# Patient Record
Sex: Female | Born: 1945 | Race: White | Hispanic: No | Marital: Married | Smoking: Former smoker
Health system: Southern US, Community
[De-identification: ages and names within clinical notes are randomized; demographics above are authoritative.]

## PROBLEM LIST (undated history)

## (undated) DIAGNOSIS — M8580 Other specified disorders of bone density and structure, unspecified site: Secondary | ICD-10-CM

## (undated) DIAGNOSIS — M26609 Unspecified temporomandibular joint disorder, unspecified side: Secondary | ICD-10-CM

## (undated) HISTORY — PX: OTHER SURGICAL HISTORY: SHX169

## (undated) HISTORY — DX: Unspecified temporomandibular joint disorder, unspecified side: M26.609

## (undated) HISTORY — DX: Other specified disorders of bone density and structure, unspecified site: M85.80

## (undated) HISTORY — PX: ABDOMINAL HYSTERECTOMY: SHX81

---

## 1998-06-03 ENCOUNTER — Other Ambulatory Visit: Admission: RE | Admit: 1998-06-03 | Discharge: 1998-06-03 | Payer: Self-pay | Admitting: *Deleted

## 1999-06-21 ENCOUNTER — Other Ambulatory Visit: Admission: RE | Admit: 1999-06-21 | Discharge: 1999-06-21 | Payer: Self-pay | Admitting: *Deleted

## 2000-06-20 ENCOUNTER — Other Ambulatory Visit: Admission: RE | Admit: 2000-06-20 | Discharge: 2000-06-20 | Payer: Self-pay | Admitting: *Deleted

## 2001-07-03 ENCOUNTER — Other Ambulatory Visit: Admission: RE | Admit: 2001-07-03 | Discharge: 2001-07-03 | Payer: Self-pay | Admitting: *Deleted

## 2003-07-29 ENCOUNTER — Ambulatory Visit (HOSPITAL_COMMUNITY): Admission: RE | Admit: 2003-07-29 | Discharge: 2003-07-29 | Payer: Self-pay | Admitting: Internal Medicine

## 2003-08-26 ENCOUNTER — Ambulatory Visit (HOSPITAL_COMMUNITY): Admission: RE | Admit: 2003-08-26 | Discharge: 2003-08-26 | Payer: Self-pay | Admitting: Internal Medicine

## 2003-08-26 ENCOUNTER — Encounter (INDEPENDENT_AMBULATORY_CARE_PROVIDER_SITE_OTHER): Payer: Self-pay | Admitting: Internal Medicine

## 2010-11-29 ENCOUNTER — Encounter: Payer: Self-pay | Admitting: Orthopedic Surgery

## 2010-12-07 ENCOUNTER — Ambulatory Visit: Payer: Self-pay | Admitting: Orthopedic Surgery

## 2010-12-07 DIAGNOSIS — M629 Disorder of muscle, unspecified: Secondary | ICD-10-CM

## 2011-01-24 NOTE — Letter (Signed)
Summary: note  note   Imported By: Jacklynn Ganong 11/29/2010 16:26:43  _____________________________________________________________________  External Attachment:    Type:   Image     Comment:   External Document

## 2011-01-26 NOTE — Letter (Signed)
Summary: History form  History form   Imported By: Jacklynn Ganong 12/07/2010 13:32:52  _____________________________________________________________________  External Attachment:    Type:   Image     Comment:   External Document

## 2011-01-26 NOTE — Assessment & Plan Note (Signed)
Summary: EVAL/TREAT RT KNEE CYST/NEED XRAY/REF DR FAGAN/[facility]   Vital Signs:  Patient profile:   65 year old female Height:      64 inches Weight:      120 pounds Pulse rate:   68 / minute Resp:     16 per minute  Vitals Entered By: Fuller Canada MD (December 07, 2010 10:47 AM)  Visit Type:  new patient Referring Zeriah Baysinger:  self Primary Mckynlee Luse:  Dr. Ouida Sills  CC:  cyst right knee.  History of Present Illness: I saw Cris Schellinger in the office today for an initial visit.  She is a 65 years old woman with the complaint of:  cyst right knee.  Xrays today.  Meds: Flexeril as needed   We have a 65 year old female, who complains of dull, 1/10. Aching sensation associated with the mass, or cyst on the lateral side of the RIGHT knee, which seems to come and go depending on whether or not. She is exercising. Symptoms started gradually. Up to this point. No associated treatments    Allergies (verified): No Known Drug Allergies  Past History:  Past Medical History: tmj  Past Surgical History: 3 C SECTIONS  Family History: FH of Cancer:   Social History: Patient is married.  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 Cardiovascular:  Denies chest pain, palpitations, fainting, and murmurs. Respiratory:  Denies short of breath, wheezing, couch, tightness, pain on inspiration, and snoring . Gastrointestinal:  Denies heartburn, nausea, vomiting, diarrhea, constipation, and blood in your stools. Genitourinary:  Denies frequency, urgency, difficulty urinating, painful urination, flank pain, and bleeding in urine. Neurologic:  Denies numbness, tingling, unsteady gait, dizziness, tremors, and seizure. Musculoskeletal:  Complains of joint pain; denies swelling, instability, stiffness, redness, heat, and muscle pain. Endocrine:  Denies  excessive thirst, exessive urination, and heat or cold intolerance. Psychiatric:  Denies nervousness, depression, anxiety, and hallucinations. Skin:  Denies changes in the skin, poor healing, rash, itching, and redness. HEENT:  Denies blurred or double vision, eye pain, redness, and watering. Immunology:  Denies seasonal allergies, sinus problems, and allergic to bee stings. Hemoatologic:  Complains of easy bleeding; denies brusing.  Physical Exam  Additional Exam:  GEN: well developed, well nourished, normal grooming and hygiene, no deformity and normal body habitus.   CDV: pulses are normal, no edema, no erythema. no tenderness  Lymph: normal lymph nodes   Skin: no rashes, skin lesions or open sores   NEURO: normal coordination, reflexes, sensation.   Psyche: awake, alert and oriented. Mood normal   Gait:  right knee:  Inspection lateral side of the knee at the iliotibial band insertion to Gertie's tubercle. There is a mass noted, which appears to be subcutaneous, and is somewhat uncomfortable to palpation, but no significant tenderness. Range of motion of the knee is normal. There is no joint effusion. Strengthening is normal. The knee is stable. McMurray sign was negative.   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 There is no evidence of cyst formation.  Impression mild degenerative joint disease.  Differential diagnosis lipoma, iliotibial band bursitis, meniscal  cyst.  Most likely diagnosis iliotibial band bursitis.  Pain right now is 1/10. I suggested that when this lesion swells that we needle aspirated and try to get some fluid to confirm the diagnosis.  Orders: New Patient Level III  (13086) Knee x-ray, 1/2 views (57846)  Patient Instructions: 1)  If you have a flare up call us and we will get you in to aspirate the cyst, take fluid out.   Orders Added: 1)  New Patient Level III [96295] 2)  Knee x-ray, 1/2 views [73560]

## 2011-05-12 NOTE — Op Note (Signed)
   NAME:  Kelsey Hill, Kelsey Hill                          ACCOUNT NO.:  [id number]   MEDICAL RECORD NO.:  [id number]                   PATIENT TYPE:  AMB   LOCATION:  DAY                                  FACILITY:  [facility]   PHYSICIAN:  Lionel December, M.D.                 DATE OF BIRTH:  [birth year]   DATE OF PROCEDURE:  07/29/2003  DATE OF DISCHARGE:                                  PROCEDURE NOTE   PROCEDURE:  Total colonoscopy.   INDICATIONS:  Kelsey Hill is a 65 year old Caucasian female who is here for  screening colonoscopy.  She is at average risk for CRC.  The procedure was  reviewed with the patient and informed consent was obtained.   PREMEDICATION:  Demerol 50 mg IV, Versed 6 mg IV.   FINDINGS:  Procedure performed in endoscopy suite.  The patient's vital  signs and O2 saturation were monitored during the procedure and remained  stable.  The patient was placed in the left lateral recumbent position and  rectal examination performed.  No abnormality noted on external or digital  exam.  The Olympus video scope was placed into the rectum and advanced under  vision to the sigmoid colon, which was somewhat tortuous and had a lot of  formed and semi-formed stool.  Slowly and carefully after vigorous washing,  I was able to advance the scope to the splenic flexure and to the transverse  colon and hepatic flexure and into the ascending colon.  This area had a lot  of stool, and the cecal landmarks were not seen at all.  As the scope was  withdrawn, the colonic mucosa was once again carefully examined and no  polyps or other mucosal abnormalities were noted.  The rectal mucosa was  normal.  The scope was retroflexed to examine the anorectal junction, and  small hemorrhoids were noted below the dentate line.  The endoscope was  straightened and withdrawn.  The patient tolerated the procedure well.   FINAL DIAGNOSES:  1. Examination performed to the ascending colon with cecal landmarks not  seen secondary to poor prep.  2. Small external hemorrhoids.   RECOMMENDATIONS:  Will bring her back in a few weeks for a BE.                                               Lionel December, M.D.    NR/MEDQ  D:  07/29/2003  T:  07/29/2003  Job:  [id number]   cc:   Kingsley Callander. Ouida Sills, M.D.  [address]  [place]  [place] [address]  Fax: [phone]

## 2012-08-02 ENCOUNTER — Telehealth: Payer: Self-pay | Admitting: Orthopedic Surgery

## 2012-08-02 NOTE — Telephone Encounter (Signed)
Received call from patient's primary care physician's office, Dr. Ouida Sills (ph [phone]), per Duwayne Heck; requests last office notes, which include Xray report.  Sent as per request, to fax # [phone].

## 2012-08-05 ENCOUNTER — Other Ambulatory Visit (HOSPITAL_COMMUNITY): Payer: Self-pay | Admitting: Internal Medicine

## 2012-08-05 DIAGNOSIS — R2240 Localized swelling, mass and lump, unspecified lower limb: Secondary | ICD-10-CM

## 2012-08-06 ENCOUNTER — Ambulatory Visit (HOSPITAL_COMMUNITY)
Admission: RE | Admit: 2012-08-06 | Discharge: 2012-08-06 | Disposition: A | Discharge status: Home / Self Care | Payer: Medicare Other | Source: Ambulatory Visit | Attending: Internal Medicine | Admitting: Internal Medicine

## 2012-08-06 DIAGNOSIS — R937 Abnormal findings on diagnostic imaging of other parts of musculoskeletal system: Secondary | ICD-10-CM | POA: Insufficient documentation

## 2012-08-06 DIAGNOSIS — M25569 Pain in unspecified knee: Secondary | ICD-10-CM | POA: Insufficient documentation

## 2012-08-06 DIAGNOSIS — R2240 Localized swelling, mass and lump, unspecified lower limb: Secondary | ICD-10-CM

## 2012-09-05 ENCOUNTER — Ambulatory Visit (INDEPENDENT_AMBULATORY_CARE_PROVIDER_SITE_OTHER): Payer: Medicare Other | Admitting: Otolaryngology

## 2012-09-05 DIAGNOSIS — H8110 Benign paroxysmal vertigo, unspecified ear: Secondary | ICD-10-CM

## 2012-09-05 DIAGNOSIS — H9209 Otalgia, unspecified ear: Secondary | ICD-10-CM

## 2013-08-15 ENCOUNTER — Encounter (INDEPENDENT_AMBULATORY_CARE_PROVIDER_SITE_OTHER): Payer: Self-pay | Admitting: *Deleted

## 2014-09-08 ENCOUNTER — Encounter (INDEPENDENT_AMBULATORY_CARE_PROVIDER_SITE_OTHER): Payer: Self-pay | Admitting: *Deleted

## 2014-10-01 ENCOUNTER — Ambulatory Visit (INDEPENDENT_AMBULATORY_CARE_PROVIDER_SITE_OTHER): Payer: 59 | Admitting: Internal Medicine

## 2014-10-01 ENCOUNTER — Other Ambulatory Visit (INDEPENDENT_AMBULATORY_CARE_PROVIDER_SITE_OTHER): Payer: Self-pay | Admitting: *Deleted

## 2014-10-01 ENCOUNTER — Telehealth (INDEPENDENT_AMBULATORY_CARE_PROVIDER_SITE_OTHER): Payer: Self-pay | Admitting: *Deleted

## 2014-10-01 ENCOUNTER — Encounter (INDEPENDENT_AMBULATORY_CARE_PROVIDER_SITE_OTHER): Payer: Self-pay | Admitting: Internal Medicine

## 2014-10-01 VITALS — BP 104/54 | HR 72 | Temp 97.7°F | Ht 63.5 in | Wt 125.7 lb

## 2014-10-01 DIAGNOSIS — R1310 Dysphagia, unspecified: Secondary | ICD-10-CM

## 2014-10-01 DIAGNOSIS — M2660 Temporomandibular joint disorder, unspecified: Secondary | ICD-10-CM

## 2014-10-01 DIAGNOSIS — Z1211 Encounter for screening for malignant neoplasm of colon: Secondary | ICD-10-CM | POA: Insufficient documentation

## 2014-10-01 DIAGNOSIS — M26609 Unspecified temporomandibular joint disorder, unspecified side: Secondary | ICD-10-CM | POA: Insufficient documentation

## 2014-10-01 DIAGNOSIS — R1314 Dysphagia, pharyngoesophageal phase: Secondary | ICD-10-CM | POA: Insufficient documentation

## 2014-10-01 MED ORDER — PEG-KCL-NACL-NASULF-NA ASC-C 100 G PO SOLR
1.0000 | Freq: Once | ORAL | Status: DC
Start: 2014-10-01 — End: 2014-11-26

## 2014-10-01 NOTE — Progress Notes (Signed)
   Subjective:    Patient ID: Kelsey Hill, female    DOB: May 14, 1946, 68 y.o.   MRN: [id number]  HPI Referred to our office by Dr. Ouida SillsFagan for dyspagia/EGD/ED and screening colonoscopy. She tells me at times she get choked. She says foods will lodge in her esophagus. Sometimes she will choke on her saliva. She will get choked on nuts.She does have GERD and sometimes she will take a Zantac 75mg  or a TUMS. Appetite is good. No weight unintentional. No abdominal pain. Robbie LouisU*suallyh has a BM x 1 a day and occasionally every other day.    08/11/2014 H and H 13.5 and 38.6, MCV 89.6, ALP 80, AST 21, ALT 15, total bili 0.4, albumin 4.4  07/29/2003 Colonoscopy: average risk screening, Dr.Rehman: FINAL DIAGNOSES:  1. Examination performed to the ascending colon with cecal landmarks not  seen secondary to poor prep.  2. Small external hemorrhoids.  RECOMMENDATIONS: Will bring her back in a few weeks for a BE.  08/26/2003 DG Colon with air: INFLAMMATORY BOWEL DISEASE.  IMPRESSION  NORMAL STUDY. IT IS OF NOTE, HOWEVER, THAT THE COLON IS EXTREMELY REDUNDANT, PARTICULARLY THE  SIGMOID COLON AND TRANSVERSE COLON AND THE BOWEL SUPERIMPOSES IN THESE AREAS AND VISUALIZATION OF  THESE AREAS IS OBSCURED.   Review of Systems Past Medical History  Diagnosis Date  . TMJ disease     Past Surgical History  Procedure Laterality Date  . C sections      x 3    Allergies  Allergen Reactions  . Oysters [Shellfish Allergy]     N and V    No current outpatient prescriptions on file prior to visit.   No current facility-administered medications on file prior to visit.        Objective:   Physical Exam  Filed Vitals:   10/01/14 1034  BP: 104/54  Pulse: 72  Temp: 97.7 F (36.5 C)  Height: 5' 3.5" (1.613 m)  Weight: 125 lb 11.2 oz (57.017 kg)   Alert and oriented. Skin warm and dry. Oral mucosa is moist.   . Sclera anicteric, conjunctivae is pink. Thyroid not enlarged. No cervical lymphadenopathy.  Lungs clear. Heart regular rate and rhythm.  Abdomen is soft. Bowel sounds are positive. No hepatomegaly. No abdominal masses felt. No tenderness.  No edema to lower extremities.         Assessment & Plan:  EGD/ED, Colonoscopy. The risks and benefits such as perforation, bleeding, and infection were reviewed with the patient and is agreeable.

## 2014-10-01 NOTE — Telephone Encounter (Signed)
Patient needs movi prep 

## 2014-10-01 NOTE — Patient Instructions (Signed)
EGD/ED Colonoscopy.  The risks and benefits such as perforation, bleeding, and infection were reviewed with the patient and is agreeable. 

## 2014-11-26 ENCOUNTER — Ambulatory Visit (HOSPITAL_COMMUNITY)
Admission: RE | Admit: 2014-11-26 | Discharge: 2014-11-26 | Disposition: A | Discharge status: Home / Self Care | Payer: Medicare Other | Source: Ambulatory Visit | Attending: Internal Medicine | Admitting: Internal Medicine

## 2014-11-26 ENCOUNTER — Encounter (HOSPITAL_COMMUNITY)
Admission: RE | Disposition: A | Discharge status: Home / Self Care | Payer: Self-pay | Source: Ambulatory Visit | Attending: Internal Medicine

## 2014-11-26 DIAGNOSIS — K208 Other esophagitis: Secondary | ICD-10-CM

## 2014-11-26 DIAGNOSIS — Z800 Family history of malignant neoplasm of digestive organs: Secondary | ICD-10-CM | POA: Diagnosis not present

## 2014-11-26 DIAGNOSIS — R1310 Dysphagia, unspecified: Secondary | ICD-10-CM

## 2014-11-26 DIAGNOSIS — K449 Diaphragmatic hernia without obstruction or gangrene: Secondary | ICD-10-CM

## 2014-11-26 DIAGNOSIS — K210 Gastro-esophageal reflux disease with esophagitis: Secondary | ICD-10-CM | POA: Diagnosis not present

## 2014-11-26 DIAGNOSIS — K649 Unspecified hemorrhoids: Secondary | ICD-10-CM | POA: Insufficient documentation

## 2014-11-26 DIAGNOSIS — K219 Gastro-esophageal reflux disease without esophagitis: Secondary | ICD-10-CM

## 2014-11-26 DIAGNOSIS — Z1211 Encounter for screening for malignant neoplasm of colon: Secondary | ICD-10-CM

## 2014-11-26 DIAGNOSIS — Z91013 Allergy to seafood: Secondary | ICD-10-CM | POA: Diagnosis not present

## 2014-11-26 HISTORY — PX: MALONEY DILATION: SHX5535

## 2014-11-26 HISTORY — PX: COLONOSCOPY: SHX5424

## 2014-11-26 HISTORY — PX: ESOPHAGOGASTRODUODENOSCOPY: SHX5428

## 2014-11-26 SURGERY — COLONOSCOPY
Anesthesia: Moderate Sedation

## 2014-11-26 MED ORDER — MIDAZOLAM HCL 5 MG/5ML IJ SOLN
INTRAMUSCULAR | Status: AC
  Filled 2014-11-26: qty 10

## 2014-11-26 MED ORDER — STERILE WATER FOR IRRIGATION IR SOLN
Status: DC | PRN
  Administered 2014-11-26: 13:00:00

## 2014-11-26 MED ORDER — MEPERIDINE HCL 50 MG/ML IJ SOLN
INTRAMUSCULAR | Status: DC | PRN
  Administered 2014-11-26 (×2): 25 mg via INTRAVENOUS

## 2014-11-26 MED ORDER — RANITIDINE HCL 150 MG PO TABS: 150.0000 mg | ORAL_TABLET | Freq: Two times a day (BID) | ORAL | Status: DC | PRN

## 2014-11-26 MED ORDER — BUTAMBEN-TETRACAINE-BENZOCAINE 2-2-14 % EX AERO
INHALATION_SPRAY | CUTANEOUS | Status: DC | PRN
  Administered 2014-11-26: 2 via TOPICAL

## 2014-11-26 MED ORDER — SODIUM CHLORIDE 0.9 % IV SOLN
INTRAVENOUS | Status: DC
  Administered 2014-11-26: 12:00:00 via INTRAVENOUS

## 2014-11-26 MED ORDER — MEPERIDINE HCL 50 MG/ML IJ SOLN
INTRAMUSCULAR | Status: AC
  Filled 2014-11-26: qty 1

## 2014-11-26 MED ORDER — MIDAZOLAM HCL 5 MG/5ML IJ SOLN
INTRAMUSCULAR | Status: DC | PRN
  Administered 2014-11-26: 2 mg via INTRAVENOUS
  Administered 2014-11-26: 1 mg via INTRAVENOUS
  Administered 2014-11-26: 2 mg via INTRAVENOUS
  Administered 2014-11-26 (×3): 1 mg via INTRAVENOUS
  Administered 2014-11-26: 2 mg via INTRAVENOUS

## 2014-11-26 NOTE — H&P (Signed)
Kelsey Hill is an 68 y.o. female.   Chief Complaint: Patient here for EGD, ED and colonoscopy. HPI: Patient is 68 year old Caucasian female who presents with several month history of intermittent dysphagia primarily to solids. She points to suprasternal areas site of bolus obstruction. She has intermittent heartburn she is able to controlled with OTC medications. She denies nausea vomiting abdominal pain or melena. She is also undergoing screening colonoscopy. She denies recent change in her bowel habits or rectal bleeding. Family history is positive for CRC in paternal aunt who was in her 77s or 52s at the time of diagnosis.  Past Medical History  Diagnosis Date  . TMJ disease     Past Surgical History  Procedure Laterality Date  . C sections      x 3    No family history on file. Social History:  reports that she has never smoked. She does not have any smokeless tobacco history on file. She reports that she drinks alcohol. She reports that she does not use illicit drugs.  Allergies:  Allergies  Allergen Reactions  . Oysters [Shellfish Allergy]     N and V    Medications Prior to Admission  Medication Sig Dispense Refill  . amitriptyline (ELAVIL) 25 MG tablet Take 25 mg by mouth at bedtime.    . Biotin 5000 MCG TABS Take 1 tablet by mouth daily.    . Calcium Citrate (CITRACAL PO) Take 1 tablet by mouth 2 (two) times daily.    . Cholecalciferol (VITAMIN D3) 5000 UNITS TABS Take 1 tablet by mouth daily.    . Multiple Vitamins-Minerals (MULTIVITAMINS THER. W/MINERALS) TABS tablet Take 1 tablet by mouth daily.    . peg 3350 powder (MOVIPREP) 100 G SOLR Take 1 kit (200 g total) by mouth once. 1 kit 0  . TURMERIC PO Take 1 tablet by mouth daily.    . vitamin C (ASCORBIC ACID) 500 MG tablet Take 500 mg by mouth daily.      No results found for this or any previous visit (from the past 48 hour(s)). No results found.  ROS  Blood pressure 155/74, pulse 85, temperature 97.7 F  (36.5 C), temperature source Oral, resp. rate 11, SpO2 99 %. Physical Exam  Constitutional: She appears well-developed and well-nourished.  HENT:  Mouth/Throat: Oropharynx is clear and moist.  Eyes: Conjunctivae are normal. No scleral icterus.  Neck: No thyromegaly present.  Cardiovascular: Normal rate, regular rhythm and normal heart sounds.   No murmur heard. Respiratory: Effort normal and breath sounds normal.  GI: Soft. She exhibits no distension and no mass. There is no tenderness.  Lower midline scar  Musculoskeletal: She exhibits no edema.  Lymphadenopathy:    She has no cervical adenopathy.  Neurological: She is alert.  Skin: Skin is warm and dry.     Assessment/Plan Solid food dysphagia and intermittent heartburn. EGD with ED and average risk screening colonoscopy.  Josedaniel Haye U 11/26/2014, 1:20 PM

## 2014-11-26 NOTE — Discharge Instructions (Signed)
Resume usual medications and diet. Zantac OTC or Ranitidine 150 mg by mouth twice daily as needed. No driving for 24 hours. Please call office with progress report in one week.  Gastrointestinal Endoscopy, Care After Refer to this sheet in the next few weeks. These instructions provide you with information on caring for yourself after your procedure. Your caregiver may also give you more specific instructions. Your treatment has been planned according to current medical practices, but problems sometimes occur. Call your caregiver if you have any problems or questions after your procedure. HOME CARE INSTRUCTIONS  If you were given medicine to help you relax (sedative), do not drive, operate machinery, or sign important documents for 24 hours.  Avoid alcohol and hot or warm beverages for the first 24 hours after the procedure.  Only take over-the-counter or prescription medicines for pain, discomfort, or fever as directed by your caregiver. You may resume taking your normal medicines unless your caregiver tells you otherwise. Ask your caregiver when you may resume taking medicines that may cause bleeding, such as aspirin, clopidogrel, or warfarin.  You may return to your normal diet and activities on the day after your procedure, or as directed by your caregiver. Walking may help to reduce any bloated feeling in your abdomen.  Drink enough fluids to keep your urine clear or pale yellow.  You may gargle with salt water if you have a sore throat. SEEK IMMEDIATE MEDICAL CARE IF:  You have severe nausea or vomiting.  You have severe abdominal pain, abdominal cramps that last longer than 6 hours, or abdominal swelling (distention).  You have severe shoulder or back pain.  You have trouble swallowing.  You have shortness of breath, your breathing is shallow, or you are breathing faster than normal.  You have a fever or a rapid heartbeat.  You vomit blood or material that looks like coffee  grounds.  You have bloody, black, or tarry stools. MAKE SURE YOU:  Understand these instructions.  Will watch your condition.  Will get help right away if you are not doing well or get worse. Document Released: 07/25/2004 Document Revised: 04/27/2014 Document Reviewed: 03/12/2012 [facility] Patient Information 2015 [employer] [place]. This information is not intended to replace advice given to you by your health care provider. Make sure you discuss any questions you have with your health care provider.

## 2014-11-26 NOTE — Op Note (Signed)
EGD PROCEDURE REPORT  PATIENT:  Kelsey Hill  MR#:  [id number] Birthdate:  [birth year], 68 y.o., female Endoscopist:  Dr. Malissa HippoNajeeb U. Joscelyn Hardrick, MD Referred By:  Dr. Carylon Perchesoy Fagan, MD  Procedure Date: 11/26/2014  Procedure:   EGD, ED & Colonoscopy  Indications:   Patient is 68 year old Caucasian female who presents with history of intermittent solid dysphagia. She has sporadic heartburn for which she takes OTC medications. She is also undergoing average risk screening colonoscopy. Last exam was in 2004 and was incomplete and followed by barium enema.           Informed Consent:  The risks, benefits, alternatives & imponderables which include, but are not limited to, bleeding, infection, perforation, drug reaction and potential missed lesion have been reviewed.  The potential for biopsy, lesion removal, esophageal dilation, etc. have also been discussed.  Questions have been answered.  All parties agreeable.  Please see history & physical in medical record for more information.  Medications:  Demerol 50 mg IV Versed 10 mg IV Cetacaine spray topically for oropharyngeal anesthesia  EGD  Description of procedure:  The endoscope was introduced through the mouth and advanced to the second portion of the duodenum without difficulty or limitations. The mucosal surfaces were surveyed very carefully during advancement of the scope and upon withdrawal.  Findings:  Esophagus:  Mucosa of the esophagus was normal. Focal erythema edema noted at GE junction without ring or stricture formation. GEJ:  37 cm Hiatus:  39 cm Stomach:  There was empty and distended very well with insufflation. Folds in the proximal stomach were normal. Examination of mucosa at body, antrum, pyloric channel, angularis, fundus and cardia was normal. Duodenum:  Normal bulbar and post bulbar mucosa.  Therapeutic/Diagnostic Maneuvers Performed:   Esophagus dilated by passing 54 French dilator to full insertion. Endoscope was passed again  and no mucosal disruption noted.  COLONOSCOPY Description of procedure:  After a digital rectal exam was performed, that colonoscope was advanced from the anus through the rectum and colon to the area of the cecum, ileocecal valve and appendiceal orifice. The cecum was deeply intubated. These structures were well-seen and photographed for the record. From the level of the cecum and ileocecal valve, the scope was slowly and cautiously withdrawn. The mucosal surfaces were carefully surveyed utilizing scope tip to flexion to facilitate fold flattening as needed. The scope was pulled down into the rectum where a thorough exam including retroflexion was performed. Examination completed using Slim scope.  Findings:   Prep excellent. Redundant, tortuous and freely mobile colon. Normal mucosa of cecum, ascending colon, hepatic flexure, transverse colon, splenic flexure, descending and sigmoid colon. Normal rectal mucosa. Small hemorrhoids below the dentate line.   Therapeutic/Diagnostic Maneuvers Performed:  None  Complications:  None  Cecal Withdrawal Time:  7 minutes  Impression:  EGD findings; Small sliding hiatal hernia with mild changes of reflux esophagitis limited to GE junction. No evidence of stricture or Schatzki's ring. Esophagus dilated by passing 54 French Maloney dilator but no mucosal disruption induced.  Colonoscopy findings; Examination performed to cecum. Very redundant and tortuous colon but no polyps or other abnormalities. Small external hemorrhoids.  Recommendations:  Continue anti-reflux measures. Zantac OTC 150 mg by mouth twice a day when necessary. Patient advised to call office with progress report in one week.  Ahnaf Caponi U  11/26/2014 3:06 PM  CC: Dr. Cranford MonFAGAN,ROY O & Dr. No ref. provider found

## 2014-11-30 ENCOUNTER — Encounter (HOSPITAL_COMMUNITY): Payer: Self-pay | Admitting: Internal Medicine

## 2014-12-16 ENCOUNTER — Encounter (INDEPENDENT_AMBULATORY_CARE_PROVIDER_SITE_OTHER): Payer: Self-pay

## 2016-06-12 ENCOUNTER — Other Ambulatory Visit (HOSPITAL_COMMUNITY): Payer: Self-pay | Admitting: Internal Medicine

## 2016-06-12 ENCOUNTER — Ambulatory Visit (HOSPITAL_COMMUNITY)
Admission: RE | Admit: 2016-06-12 | Discharge: 2016-06-12 | Disposition: A | Discharge status: Home / Self Care | Payer: Medicare Other | Source: Ambulatory Visit | Attending: Internal Medicine | Admitting: Internal Medicine

## 2016-06-12 DIAGNOSIS — R52 Pain, unspecified: Secondary | ICD-10-CM

## 2016-06-12 DIAGNOSIS — S8261XA Displaced fracture of lateral malleolus of right fibula, initial encounter for closed fracture: Secondary | ICD-10-CM | POA: Diagnosis not present

## 2016-06-12 DIAGNOSIS — X58XXXA Exposure to other specified factors, initial encounter: Secondary | ICD-10-CM | POA: Diagnosis not present

## 2016-07-04 ENCOUNTER — Other Ambulatory Visit (HOSPITAL_COMMUNITY): Payer: Self-pay | Admitting: Orthopedic Surgery

## 2016-07-04 ENCOUNTER — Ambulatory Visit (HOSPITAL_COMMUNITY)
Admission: RE | Admit: 2016-07-04 | Discharge: 2016-07-04 | Disposition: A | Discharge status: Home / Self Care | Payer: Medicare Other | Source: Ambulatory Visit | Attending: Orthopedic Surgery | Admitting: Orthopedic Surgery

## 2016-07-04 DIAGNOSIS — X58XXXD Exposure to other specified factors, subsequent encounter: Secondary | ICD-10-CM | POA: Insufficient documentation

## 2016-07-04 DIAGNOSIS — T148XXA Other injury of unspecified body region, initial encounter: Secondary | ICD-10-CM

## 2016-07-04 DIAGNOSIS — M25471 Effusion, right ankle: Secondary | ICD-10-CM | POA: Diagnosis not present

## 2016-07-04 DIAGNOSIS — S8264XD Nondisplaced fracture of lateral malleolus of right fibula, subsequent encounter for closed fracture with routine healing: Secondary | ICD-10-CM | POA: Insufficient documentation

## 2016-08-09 ENCOUNTER — Encounter (HOSPITAL_COMMUNITY): Payer: Self-pay | Admitting: Physical Therapy

## 2016-08-09 ENCOUNTER — Ambulatory Visit (HOSPITAL_COMMUNITY): Payer: Medicare Other | Attending: Orthopedic Surgery | Admitting: Physical Therapy

## 2016-08-09 DIAGNOSIS — M25571 Pain in right ankle and joints of right foot: Secondary | ICD-10-CM | POA: Diagnosis not present

## 2016-08-09 DIAGNOSIS — R2689 Other abnormalities of gait and mobility: Secondary | ICD-10-CM

## 2016-08-09 DIAGNOSIS — M25674 Stiffness of right foot, not elsewhere classified: Secondary | ICD-10-CM | POA: Diagnosis present

## 2016-08-09 DIAGNOSIS — M6281 Muscle weakness (generalized): Secondary | ICD-10-CM | POA: Diagnosis present

## 2016-08-09 DIAGNOSIS — M25671 Stiffness of right ankle, not elsewhere classified: Secondary | ICD-10-CM | POA: Insufficient documentation

## 2016-08-09 NOTE — Therapy (Signed)
[facility] [address] [place], [place], [address] Phone: [phone]   Fax:  [phone]  Physical Therapy Evaluation  Patient Details  Name: Kelsey Hill MRN: [id number] Date of Birth: Mar 08, 1946 Referring Provider: Salvatore Marvelobert Wainer, MD  Encounter Date: 08/09/2016      PT End of Session - 08/09/16 1008    Visit Number 1   Number of Visits 5   Date for PT Re-Evaluation 09/15/16   Authorization Type [employer] Medicare   Authorization Time Period 08/09/16 to 09/15/16   Authorization - Visit Number 1   Authorization - Number of Visits 5   PT Start Time 0901   PT Stop Time 0945   PT Time Calculation (min) 44 min   Activity Tolerance Patient tolerated treatment well   Behavior During Therapy [facility] for tasks assessed/performed      Past Medical History:  Diagnosis Date  . TMJ disease     Past Surgical History:  Procedure Laterality Date  . c sections     x 3  . COLONOSCOPY N/A 11/26/2014   Procedure: COLONOSCOPY;  Surgeon: Malissa HippoNajeeb U Rehman, MD;  [facility];  Service: Endoscopy;  Laterality: N/A;  200 - moved to 12/3 @ 1:00 - Ann notified pt  . ESOPHAGOGASTRODUODENOSCOPY N/A 11/26/2014   Procedure: ESOPHAGOGASTRODUODENOSCOPY (EGD);  Surgeon: Malissa HippoNajeeb U Rehman, MD;  [facility];  Service: Endoscopy;  Laterality: N/A;  . Elease HashimotoMALONEY DILATION N/A 11/26/2014   Procedure: Elease HashimotoMALONEY DILATION;  Surgeon: Malissa HippoNajeeb U Rehman, MD;  [facility];  Service: Endoscopy;  Laterality: N/A;    There were no vitals filed for this visit.       Subjective Assessment - 08/09/16 0905    Subjective Pt reports fracturing her ankle 06/08/16 when she was outside pruning some bushes. She was pulling, stepped back into a hole and felt a pop. She walked around on it for 4 days and eventually went to get treatment. She was placed in a boot for 8 weeks and is now wearing an ankle brace that she was instructed to wear for 2 more weeks during the  day. She denies any pain currently, and feels that it only bothers her while sleeping when she puts direct pressure on her ankle.    Pertinent History TMJ; reports mild osteopenia but receives bone scans every year   Limitations Other (comment)  going down steps    How long can you sit comfortably? unlimited   How long can you stand comfortably? unlimited    How long can you walk comfortably? limited with minimal pain, but she will walk as far as she needs to     Diagnostic tests X-ray: Minimal partial healing of the the demonstrated lateral malleolus   Patient Stated Goals get back to her aerobics and pilates   Currently in Pain? No/denies   Pain Score 2    Pain Location --  lateral malleolus   Pain Orientation Right   Pain Descriptors / Indicators Aching   Pain Type Acute pain   Pain Radiating Towards none    Pain Onset More than a month ago   Pain Frequency Other (Comment)  only occasionally at night    Aggravating Factors  direct pressure   Pain Relieving Factors not really bothering her that much    Effect of Pain on Daily Activities not limiting her    Multiple Pain Sites No            [facility]  PT Assessment - 08/09/16 0001      Assessment   Medical Diagnosis Rt ankle fracture   Referring Provider Salvatore Marvel, MD   Onset Date/Surgical Date 06/08/16   Next MD Visit 08/22/16   Prior Therapy none      Precautions   Precautions Other (comment)   Precaution Comments no long distance walking, no aerobic dancing     Restrictions   Weight Bearing Restrictions No     Balance Screen   Has the patient fallen in the past 6 months No   Has the patient had a decrease in activity level because of a fear of falling?  No   Is the patient reluctant to leave their home because of a fear of falling?  No     Home [employer] information [employer] manager residence   Living Arrangements Spouse/significant other   Additional Comments 14 steps up to bedroom on 2nd floor, 2-3 STE      Prior Function   Level of Independence Independent   Leisure aerobic dancing and pilates several times a week     Cognition   Overall Cognitive Status Within Functional Limits for tasks assessed     Observation/Other Assessments   Observations minimal swelling noted posterior Rt lateral malleolus     Sensation   Light Touch Appears Intact     ROM / Strength   AROM / PROM / Strength AROM;Strength     AROM   AROM Assessment Site Ankle;Other (comment)  Big toe extension: Rt 10 deg, Lt 25 deg   Right/Left Ankle Right;Left   Right Ankle Dorsiflexion 5   Right Ankle Plantar Flexion 40   Right Ankle Inversion 15   Right Ankle Eversion 20   Left Ankle Dorsiflexion 5   Left Ankle Plantar Flexion 43   Left Ankle Inversion 25   Left Ankle Eversion 20     Strength   Strength Assessment Site Knee;Ankle   Right/Left Knee --   Right/Left Ankle Right;Left   Right Ankle Dorsiflexion 5/5   Right Ankle Plantar Flexion 5/5  discomfort reported in heel   Right Ankle Inversion 5/5  discomfort reported in heel    Right Ankle Eversion 5/5   Left Ankle Dorsiflexion 5/5   Left Ankle Plantar Flexion 5/5   Left Ankle Inversion 5/5   Left Ankle Eversion 5/5     Palpation   Palpation comment minimal tenderness to palpation along Rt lateral malleolus     Ambulation/Gait   Ambulation/Gait Yes   Ambulation Distance (Feet) 50 Feet   Assistive device None  wearing ankle brace   Stairs Yes   Stairs Assistance 6: Modified independent (Device/Increase time)   Gait Comments Rt foot eversion, decreased big toe extension and plantar flexion during terminal stance and pre-swing portion of gait cycle; ascend 6" steps with reciprocal pattern and modI, descend 6' steps with 1 handrail and reciprocal pattern, noting decreased Rt big toe extension/dorsiflexion when leading with LLE (x3 trials)     Balance   Balance Assessed Yes     Static Standing Balance   Static Standing - Comment/# of Minutes SLS:  Rt 8 sec, Lt: 30 sec x3 trials                    [facility] Adult PT Treatment/Exercise - 08/09/16 0001      Exercises   Exercises Ankle     Ankle Exercises: Stretches   Gastroc Stretch 1 rep;30 seconds   Gastroc Stretch Limitations  seated   Other Stretch inversion stretch with towel x30 sec  HEP demo   Other Stretch big toe ext. stretch 3x10 sec  HEP demo                 PT Education - 08/09/16 1006    Education provided Yes   Education Details HEP; importance of big toe extension during walking/stair negotiation; eval findings/POC   Person(s) Educated Patient   Methods Explanation;Demonstration;Handout   Comprehension Verbalized understanding;Returned demonstration          PT Short Term Goals - 08/09/16 1022      PT SHORT TERM GOAL #1   Title Pt will demo consistency and independence with HEP to improve ankle ROM.   Time 2   Period Weeks   Status New     PT SHORT TERM GOAL #2   Title Pt will demo improved SLS on her RLE to atleast 15 sec, to decrease risk of falls and reinjury.   Time 2   Period Weeks   Status New           PT Long Term Goals - 08/09/16 1023      PT LONG TERM GOAL #1   Title Pt will improved big toe extension to atleast 20 degrees to allow her to demonstrate proper gait mechanics    Time 5   Period Weeks   Status New     PT LONG TERM GOAL #2   Title Pt will demonstrate improved ankle inversion by atleast 10 degrees.   Time 5   Period Weeks   Status New     PT LONG TERM GOAL #3   Title Pt will demo improved balance evideny by ability to maintain SLS to atleast 25 sec without LOB, 2/3 trials.   Time 5   Period Weeks   Status New     PT LONG TERM GOAL #4   Title Pt will ascend/descend 6" steps without handrails and reciprocal pattern, demonstrating symmetrical ankle dorsiflexion/MTP extension, x5 trials.    Time 5   Period Weeks   Status New               Plan - 08/09/16 1009    Clinical Impression  Statement Mrs. Belvin is a pleasant 70yo F referred to O[facility] s/p Rt ankle fracture on 06/08/16. She spent anywhere from 5-8 weeks in a boot and is now wearing a soft ankle brace throughout the day. She presents with limited ROM, balance and minimal swelling/pain which is preventing her from returning to participation in aerobics and pilates. She demonstrates good ankle strength and ROM except for limited Rt big toe extension and inversion compared to her LLE. These impairments are evident during trials of stair negotiation and ambulation during her eval. She would benefit from skilled PT to address her limitations and improve balance, ROM and functional mobility.    Rehab Potential Good   PT Frequency 1x / week   PT Duration Other (comment)  5 weeks   PT Treatment/Interventions Cryotherapy;ADLs/Self Care Home Management;Neuromuscular re-education;Balance training;Therapeutic exercise;Therapeutic activities;Functional mobility training;Stair training;Gait training;Patient/family education;Orthotic Fit/Training;Manual techniques;Passive range of motion   PT Next Visit Plan discuss parameters for ice/elevation if pt demonstrates swelling; stretching big toe/inversion/DF specifically, ankle 4 way for endurance, static balance activity   PT Home Exercise Plan big toe ext (10x10, calf stretch/ankle inversion stretch (3x30), single leg stance   Recommended Other Services none    Consulted and Agree with Plan of Care Patient  Patient will benefit from skilled therapeutic intervention in order to improve the following deficits and impairments:  Abnormal gait, Decreased balance, Decreased range of motion, Decreased mobility, Hypomobility, Increased edema, Pain, Improper body mechanics, Difficulty walking  Visit Diagnosis: Pain in right ankle and joints of right foot  Stiffness of ankle joint, right  Stiffness of right foot, not elsewhere classified  Other abnormalities of gait and mobility  Muscle  weakness (generalized)      G-Codes - 08/09/16 1032    Functional Assessment Tool Used FOTO: 45% limited   Functional Limitation Mobility: Walking and moving around   Mobility: Walking and Moving Around Current Status [phone]) At least 20 percent but less than 40 percent impaired, limited or restricted   Mobility: Walking and Moving Around Goal Status [phone]([id number]) At least 1 percent but less than 20 percent impaired, limited or restricted       Problem List Patient Active Problem List   Diagnosis Date Noted  . Dysphagia, pharyngoesophageal phase 10/01/2014  . Encounter for screening colonoscopy 10/01/2014  . TMJ disease 10/01/2014  . ILIOTIBIAL BAND SYNDROME 12/07/2010   10:36 AM,08/09/16 Marylyn IshiharaSara Kiser PT, DPT [facility] [phone]  [facility] [address] [place], [place], [address] Phone: [phone]   Fax:  [phone]  Name: Kelsey Hill MRN: [id number] Date of Birth: 11-17-1946

## 2016-08-15 ENCOUNTER — Ambulatory Visit (HOSPITAL_COMMUNITY): Payer: Medicare Other

## 2016-08-15 ENCOUNTER — Telehealth (HOSPITAL_COMMUNITY): Payer: Self-pay

## 2016-08-15 DIAGNOSIS — M25671 Stiffness of right ankle, not elsewhere classified: Secondary | ICD-10-CM

## 2016-08-15 DIAGNOSIS — M25571 Pain in right ankle and joints of right foot: Secondary | ICD-10-CM

## 2016-08-15 DIAGNOSIS — M25674 Stiffness of right foot, not elsewhere classified: Secondary | ICD-10-CM

## 2016-08-15 DIAGNOSIS — R2689 Other abnormalities of gait and mobility: Secondary | ICD-10-CM

## 2016-08-15 DIAGNOSIS — M6281 Muscle weakness (generalized): Secondary | ICD-10-CM

## 2016-08-15 NOTE — Telephone Encounter (Signed)
PT emailed patient updated HEP to [facility] [email or web address].      3:49 PM, 08/15/16 Rosamaria LintsAllan C Victorina Kable, PT, DPT Physical Therapist at [facility] [phone] (office)

## 2016-08-15 NOTE — Therapy (Signed)
[facility] [address] [place], [place], [address] Phone: [phone]   Fax:  [phone]  Physical Therapy Treatment  Patient Details  Name: Kelsey Hill MRN: [id number] Date of Birth: [birth year] Referring Provider: Salvatore Marvel, MD  Encounter Date: 08/15/2016      PT End of Session - 08/15/16 1225    Visit Number 2   Number of Visits 5   Date for PT Re-Evaluation 09/15/16   Authorization Type [employer] Medicare   Authorization Time Period 08/09/16 to 09/15/16   Authorization - Visit Number 2   Authorization - Number of Visits 5   PT Start Time 0947   PT Stop Time 1032   PT Time Calculation (min) 45 min   Activity Tolerance Patient tolerated treatment well;No increased pain   Behavior During Therapy WFL for tasks assessed/performed      Past Medical History:  Diagnosis Date  . TMJ disease     Past Surgical History:  Procedure Laterality Date  . c sections     x 3  . COLONOSCOPY N/A 11/26/2014   Procedure: COLONOSCOPY;  Surgeon: Malissa Hippo, MD;  [facility];  Service: Endoscopy;  Laterality: N/A;  200 - moved to 12/3 @ 1:00 - Ann notified pt  . ESOPHAGOGASTRODUODENOSCOPY N/A 11/26/2014   Procedure: ESOPHAGOGASTRODUODENOSCOPY (EGD);  Surgeon: Malissa Hippo, MD;  [facility];  Service: Endoscopy;  Laterality: N/A;  . Elease Hashimoto DILATION N/A 11/26/2014   Procedure: Elease Hashimoto DILATION;  Surgeon: Malissa Hippo, MD;  [facility];  Service: Endoscopy;  Laterality: N/A;    There were no vitals filed for this visit.      Subjective Assessment - 08/15/16 1219    Subjective Pt reports she has been working on her HEP but pain in the ball of her foot is still very limiting and concerning.    Pertinent History TMJ; reports mild osteopenia but receives bone scans every year   Limitations Walking   Currently in Pain? No/denies                         [facility] Adult PT  Treatment/Exercise - 08/15/16 0001      Exercises   Exercises Ankle     Ankle Exercises: Stretches   Gastroc Stretch 30 seconds;3 reps  HEP review; VC to pay attention to time   Other Stretch inversion stretch with towel x30 sec  DC from HEP temporarily, aggravates ankle pain.)    Other Stretch Gross Toe Extension Stretch: CKC 3x30sec  HEP update for pain free motion, knee bent      Ankle Exercises: Standing   Other Standing Ankle Exercises Narrow Stance Gross Toe Extension   15x5sec     Ankle Exercises: Seated   Other Seated Ankle Exercises R FABER Ankle inversion RedTB  2x10    Other Seated Ankle Exercises Gross Toe Flexion: bil   15x3sec bil                PT Education - 08/15/16 1224    Education provided Yes   Education Details Explained importance of paying attention to hold times on HEP for consistency.    Person(s) Educated Patient   Methods Explanation;Demonstration   Comprehension Verbalized understanding;Returned demonstration          PT Short Term Goals - 08/09/16 1022      PT SHORT TERM GOAL #1   Title Pt will demo consistency  and independence with HEP to improve ankle ROM.   Time 2   Period Weeks   Status New     PT SHORT TERM GOAL #2   Title Pt will demo improved SLS on her RLE to atleast 15 sec, to decrease risk of falls and reinjury.   Time 2   Period Weeks   Status New           PT Long Term Goals - 08/09/16 1023      PT LONG TERM GOAL #1   Title Pt will improved big toe extension to atleast 20 degrees to allow her to demonstrate proper gait mechanics    Time 5   Period Weeks   Status New     PT LONG TERM GOAL #2   Title Pt will demonstrate improved ankle inversion by atleast 10 degrees.   Time 5   Period Weeks   Status New     PT LONG TERM GOAL #3   Title Pt will demo improved balance evideny by ability to maintain SLS to atleast 25 sec without LOB, 2/3 trials.   Time 5   Period Weeks   Status New     PT LONG TERM  GOAL #4   Title Pt will ascend/descend 6" steps without handrails and reciprocal pattern, demonstrating symmetrical ankle dorsiflexion/MTP extension, x5 trials.    Time 5   Period Weeks   Status New               Plan - 08/15/16 1226    Clinical Impression Statement Pt tolerating session well today without agravation of pain or other Sx. Treatment goals are reviewed in full. HEP is reviewed and corrected as needed. Some exercises in HEP are updated to minimze complaint of pain. The pt is asked to minimize some portions of her regular exercise routine that are contraindicated at this time. Balance is coming along well. Ankle Dorsiflexion ROM is equal no, but still limited bilat. Hallux extension remains limited by around 35%,    Rehab Potential Good   PT Frequency 1x / week   PT Duration Other (comment)  5 weeks   PT Treatment/Interventions Cryotherapy;ADLs/Self Care Home Management;Neuromuscular re-education;Balance training;Therapeutic exercise;Therapeutic activities;Functional mobility training;Stair training;Gait training;Patient/family education;Orthotic Fit/Training;Manual techniques;Passive range of motion   PT Next Visit Plan stretching 1st MTP ext/inversion/DF specifically, ankle 4 way for endurance, static balance activity   PT Home Exercise Plan SLS balance: 10x3 seconds; Long sitting calf stretch: 3x30s; CKC toe ext stretch: 3x30s; DC'd inversion stretch    Consulted and Agree with Plan of Care Patient      Patient will benefit from skilled therapeutic intervention in order to improve the following deficits and impairments:  Abnormal gait, Decreased balance, Decreased range of motion, Decreased mobility, Hypomobility, Increased edema, Pain, Improper body mechanics, Difficulty walking  Visit Diagnosis: Pain in right ankle and joints of right foot  Stiffness of ankle joint, right  Stiffness of right foot, not elsewhere classified  Other abnormalities of gait and  mobility  Muscle weakness (generalized)     Problem List Patient Active Problem List   Diagnosis Date Noted  . Dysphagia, pharyngoesophageal phase 10/01/2014  . Encounter for screening colonoscopy 10/01/2014  . TMJ disease 10/01/2014  . ILIOTIBIAL BAND SYNDROME 12/07/2010    1:00 PM, 08/15/16 Rosamaria LintsAllan C Deleah Tison, PT, DPT Physical Therapist at [facility] [phone] (office)     [facility] [address] [place], [place], [address]  Phone: [phone]   Fax:  [phone]  Name: Kelsey Hill MRN: [id number] Date of Birth: 10/31/1946

## 2016-08-23 ENCOUNTER — Ambulatory Visit (HOSPITAL_COMMUNITY): Payer: Medicare Other | Admitting: Physical Therapy

## 2016-08-23 DIAGNOSIS — R2689 Other abnormalities of gait and mobility: Secondary | ICD-10-CM

## 2016-08-23 DIAGNOSIS — M25671 Stiffness of right ankle, not elsewhere classified: Secondary | ICD-10-CM

## 2016-08-23 DIAGNOSIS — M25571 Pain in right ankle and joints of right foot: Secondary | ICD-10-CM

## 2016-08-23 DIAGNOSIS — M25674 Stiffness of right foot, not elsewhere classified: Secondary | ICD-10-CM

## 2016-08-23 DIAGNOSIS — M6281 Muscle weakness (generalized): Secondary | ICD-10-CM

## 2016-08-23 NOTE — Therapy (Signed)
[facility] [address] [place], [place], [address] Phone: [phone]   Fax:  [phone]  Physical Therapy Treatment  Patient Details  Name: Kelsey Hill MRN: [id number] Date of Birth: 05/24/1946 Referring Provider: Salvatore Marvel, MD  Encounter Date: 08/23/2016      PT End of Session - 08/23/16 1011    Visit Number 3   Number of Visits 5   Date for PT Re-Evaluation 09/15/16   Authorization Type [employer] Medicare   Authorization Time Period 08/09/16 to 09/15/16   Authorization - Visit Number 2   Authorization - Number of Visits 5   PT Start Time 0946   PT Stop Time 1026   PT Time Calculation (min) 40 min   Activity Tolerance Patient tolerated treatment well;No increased pain   Behavior During Therapy WFL for tasks assessed/performed      Past Medical History:  Diagnosis Date  . TMJ disease     Past Surgical History:  Procedure Laterality Date  . c sections     x 3  . COLONOSCOPY N/A 11/26/2014   Procedure: COLONOSCOPY;  Surgeon: Malissa Hippo, MD;  [facility];  Service: Endoscopy;  Laterality: N/A;  200 - moved to 12/3 @ 1:00 - Ann notified pt  . ESOPHAGOGASTRODUODENOSCOPY N/A 11/26/2014   Procedure: ESOPHAGOGASTRODUODENOSCOPY (EGD);  Surgeon: Malissa Hippo, MD;  [facility];  Service: Endoscopy;  Laterality: N/A;  . Elease Hashimoto DILATION N/A 11/26/2014   Procedure: Elease Hashimoto DILATION;  Surgeon: Malissa Hippo, MD;  [facility];  Service: Endoscopy;  Laterality: N/A;    There were no vitals filed for this visit.      Subjective Assessment - 08/23/16 1045    Subjective Pt reports things are going well. The ball of her foot continues to bother her when walking on her toes, but once she performs her exercise and gets things moving she notices it goes away.    Pertinent History TMJ; reports mild osteopenia but receives bone scans every year   Limitations Walking   Currently in Pain? No/denies                          [facility] Adult PT Treatment/Exercise - 08/23/16 0001      Manual Therapy   Manual Therapy Passive ROM;Joint mobilization   Manual therapy comments separate rest of treatment   Joint Mobilization Grade III-IV Medial/lateral talocrural jt mob    Passive ROM ankle inversion 10x10 sec hold, Rt 1st MTP ext 5x10 sec     Ankle Exercises: Seated   Towel Crunch 3 reps increased time to complete   Other Seated Ankle Exercises ankle 4 way x20, red TB   Other Seated Ankle Exercises Rt MTP CKC extension stretch 2x30 sec;  Rt ankle inversion stretch 3x30 sec                PT Education - 08/23/16 1043    Education provided Yes   Education Details difference between injuring pain and stretch pain during HEP; encouraged updated HEP adherence; technique with therex   Person(s) Educated Patient   Methods Explanation;Demonstration;Verbal cues;Handout   Comprehension Verbalized understanding;Returned demonstration          PT Short Term Goals - 08/09/16 1022      PT SHORT TERM GOAL #1   Title Pt will demo consistency and independence with HEP to improve ankle ROM.   Time 2  Period Weeks   Status New     PT SHORT TERM GOAL #2   Title Pt will demo improved SLS on her RLE to atleast 15 sec, to decrease risk of falls and reinjury.   Time 2   Period Weeks   Status New           PT Long Term Goals - 08/09/16 1023      PT LONG TERM GOAL #1   Title Pt will improved big toe extension to atleast 20 degrees to allow her to demonstrate proper gait mechanics    Time 5   Period Weeks   Status New     PT LONG TERM GOAL #2   Title Pt will demonstrate improved ankle inversion by atleast 10 degrees.   Time 5   Period Weeks   Status New     PT LONG TERM GOAL #3   Title Pt will demo improved balance evideny by ability to maintain SLS to atleast 25 sec without LOB, 2/3 trials.   Time 5   Period Weeks   Status New     PT LONG TERM GOAL #4    Title Pt will ascend/descend 6" steps without handrails and reciprocal pattern, demonstrating symmetrical ankle dorsiflexion/MTP extension, x5 trials.    Time 5   Period Weeks   Status New               Plan - 08/23/16 1030    Clinical Impression Statement Today's session continued focus on progression of ankle ROM and strength. Pt making progress towards her goals evident by improved single leg balance up to 20 sec each, ROM and activity tolerance. Performed manual techniques and PROM prior to start of today's session with good tolerance and no report of pain. Updated and reviewed HEP with pt returning correct demonstration with verbal cues from the therapist. Will follow up at next visit to ensure proper technique is being retained.   Rehab Potential Good   PT Frequency 1x / week   PT Duration Other (comment)  5 weeks   PT Treatment/Interventions Cryotherapy;ADLs/Self Care Home Management;Neuromuscular re-education;Balance training;Therapeutic exercise;Therapeutic activities;Functional mobility training;Stair training;Gait training;Patient/family education;Orthotic Fit/Training;Manual techniques;Passive range of motion   PT Next Visit Plan PROM 1st MTP ext/ankle inversion, progress ankle 4 way for endurance, static balance activity   PT Home Exercise Plan CKC toe ext stretch: 3x30s; inversion stretch as tolerated 3x30 sec; ankle 4 way with red TB x20, towel scrunch   Recommended Other Services none    Consulted and Agree with Plan of Care Patient      Patient will benefit from skilled therapeutic intervention in order to improve the following deficits and impairments:  Abnormal gait, Decreased balance, Decreased range of motion, Decreased mobility, Hypomobility, Increased edema, Pain, Improper body mechanics, Difficulty walking  Visit Diagnosis: Pain in right ankle and joints of right foot  Stiffness of ankle joint, right  Stiffness of right foot, not elsewhere  classified  Other abnormalities of gait and mobility  Muscle weakness (generalized)     Problem List Patient Active Problem List   Diagnosis Date Noted  . Dysphagia, pharyngoesophageal phase 10/01/2014  . Encounter for screening colonoscopy 10/01/2014  . TMJ disease 10/01/2014  . ILIOTIBIAL BAND SYNDROME 12/07/2010   10:46 AM,08/23/16 Marylyn Ishihara PT, DPT [facility] [phone]  [facility] [address] [place], [place], [address] Phone: [phone]   Fax:  [phone]  Name: DARTHULA DESA MRN: [id number]  Date of Birth: Aug 13, 1946

## 2016-08-30 ENCOUNTER — Ambulatory Visit (HOSPITAL_COMMUNITY): Payer: Medicare Other | Attending: Orthopedic Surgery | Admitting: Physical Therapy

## 2016-08-30 DIAGNOSIS — M25571 Pain in right ankle and joints of right foot: Secondary | ICD-10-CM | POA: Diagnosis not present

## 2016-08-30 DIAGNOSIS — M6281 Muscle weakness (generalized): Secondary | ICD-10-CM | POA: Diagnosis present

## 2016-08-30 DIAGNOSIS — R2689 Other abnormalities of gait and mobility: Secondary | ICD-10-CM

## 2016-08-30 DIAGNOSIS — M25671 Stiffness of right ankle, not elsewhere classified: Secondary | ICD-10-CM | POA: Diagnosis present

## 2016-08-30 DIAGNOSIS — M25674 Stiffness of right foot, not elsewhere classified: Secondary | ICD-10-CM | POA: Diagnosis present
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
## 2016-09-15 ENCOUNTER — Ambulatory Visit (HOSPITAL_COMMUNITY): Payer: Medicare Other | Admitting: Physical Therapy

## 2016-09-15 DIAGNOSIS — M25674 Stiffness of right foot, not elsewhere classified: Secondary | ICD-10-CM

## 2016-09-15 DIAGNOSIS — M25671 Stiffness of right ankle, not elsewhere classified: Secondary | ICD-10-CM

## 2016-09-15 DIAGNOSIS — M6281 Muscle weakness (generalized): Secondary | ICD-10-CM

## 2016-09-15 DIAGNOSIS — M25571 Pain in right ankle and joints of right foot: Secondary | ICD-10-CM

## 2016-09-15 DIAGNOSIS — R2689 Other abnormalities of gait and mobility: Secondary | ICD-10-CM

## 2016-09-15 NOTE — Therapy (Signed)
[place], [place], [address] Phone: [phone]   Fax:  [phone]  Physical Therapy Treatment/Discharge   Patient Details  Name: Kelsey Hill MRN: [id number] Date of Birth: 27-Aug-1946 Referring Provider: Elsie Saas, MD  Encounter Date: 09/15/2016      PT End of Session - 09/15/16 1107    Visit Number 5   Number of Visits 5   Date for PT Re-Evaluation 09/15/16   Authorization Type [employer]HC Medicare   Authorization Time Period 08/09/16 to 09/15/16   Authorization - Visit Number 5   Authorization - Number of Visits 5   PT Start Time 6378   PT Stop Time 1037   PT Time Calculation (min) 50 min   Activity Tolerance Patient tolerated treatment well;No increased pain   Behavior During Therapy WFL for tasks assessed/performed      Past Medical History:  Diagnosis Date  . TMJ disease     Past Surgical History:  Procedure Laterality Date  . c sections     x 3  . COLONOSCOPY N/A 11/26/2014   Procedure: COLONOSCOPY;  Surgeon: Rogene Houston, MD;  [facility];  Service: Endoscopy;  Laterality: N/A;  200 - moved to 12/3 @ 1:00 - Ann notified pt  . ESOPHAGOGASTRODUODENOSCOPY N/A 11/26/2014   Procedure: ESOPHAGOGASTRODUODENOSCOPY (EGD);  Surgeon: Rogene Houston, MD;  [facility];  Service: Endoscopy;  Laterality: N/A;  . Venia Minks DILATION N/A 11/26/2014   Procedure: Venia Minks DILATION;  Surgeon: Rogene Houston, MD;  [facility];  Service: Endoscopy;  Laterality: N/A;    There were no vitals filed for this visit.      Subjective Assessment - 09/15/16 0952    Subjective Pt reports that she is feeling her foot is worse than it was several weeks ago. She noticed increased "discomfort" along her Rt calf which started ~2 weeks ago as well (approximately the same time she reports removing her ankle bace). She went to the beach and was going up/down the stairs a lot which seemed to increase  the discomfort.    Pertinent History TMJ; reports mild osteopenia but receives bone scans every year   How long can you sit comfortably? unlimited   How long can you stand comfortably? unlimited    How long can you walk comfortably? unlimited    Diagnostic tests X-ray: Minimal partial healing of the the demonstrated lateral malleolus   Patient Stated Goals get back to her aerobics and pilates   Currently in Pain? No/denies   Pain Onset More than a month ago            [facility] PT Assessment - 09/15/16 0001      Assessment   Medical Diagnosis Rt ankle fracture   Referring Provider Elsie Saas, MD   Onset Date/Surgical Date 06/08/16   Next MD Visit 08/22/16   Prior Therapy none      Precautions   Precautions Other (comment)   Precaution Comments no long distance walking, no aerobic dancing     Restrictions   Weight Bearing Restrictions No     Balance Screen   Has the patient fallen in the past 6 months No   Has the patient had a decrease in activity level because of a fear of falling?  No   Is the patient reluctant to leave their home because of a fear of falling?  No     Home Environment   Living Environment  Private residence   Living Arrangements Spouse/significant other   Additional Comments 14 steps up to bedroom on 2nd floor, 2-3 STE     Prior Function   Level of Independence Independent   Leisure aerobic dancing and pilates several times a week     Cognition   Overall Cognitive Status Within Functional Limits for tasks assessed     Observation/Other Assessments   Observations no swelling noted     Sensation   Light Touch Appears Intact     AROM   Right Ankle Dorsiflexion 12   Right Ankle Plantar Flexion 40   Right Ankle Inversion 25   Right Ankle Eversion 20   Left Ankle Dorsiflexion 10   Left Ankle Plantar Flexion 41   Left Ankle Inversion 25   Left Ankle Eversion 20     Strength   Right Ankle Dorsiflexion 5/5   Right Ankle Plantar Flexion 5/5   discomfort noted Rt gastroc/soleus   Right Ankle Inversion 5/5   Right Ankle Eversion 5/5   Left Ankle Dorsiflexion 5/5   Left Ankle Plantar Flexion 5/5   Left Ankle Inversion 5/5   Left Ankle Eversion 5/5     Palpation   Palpation comment non tender with palpation     Ambulation/Gait   Ambulation/Gait Yes   Ambulation Distance (Feet) 60 Feet   Assistive device None  wearing ankle brace   Stairs Yes   Stairs Assistance 7: Independent   Gait Comments WNL, no antalgic pattern noted      Balance   Balance Assessed Yes     Static Standing Balance   Static Standing - Comment/# of Minutes SLS: 30 sec BLE                     [facility] Adult PT Treatment/Exercise - 09/15/16 0001      Ambulation/Gait   Pre-Gait Activities ascend/descend 6" steps without handrails, no unsteadiness/LOB noted. symmetrical steps, reciprocal pattern. pain free                 PT Education - 09/15/16 1108    Education provided Yes   Education Details reviewed reassessment findings and goals/progress made since beginning PT; encouraged easing back into aerobic videos at home; provided contact email for questions/concens    Person(s) Educated Patient   Methods Explanation;Demonstration   Comprehension Verbalized understanding;Returned demonstration          PT Short Term Goals - 09/15/16 1020      PT SHORT TERM GOAL #1   Title Pt will demo consistency and independence with HEP to improve ankle ROM.   Time 2   Period Weeks   Status Achieved     PT SHORT TERM GOAL #2   Title Pt will demo improved SLS on her RLE to atleast 15 sec, to decrease risk of falls and reinjury.   Baseline 30 sec, BLE   Time 2   Period Weeks   Status Achieved           PT Long Term Goals - 09/15/16 1021      PT LONG TERM GOAL #1   Title Pt will improved big toe extension to atleast 20 degrees to allow her to demonstrate proper gait mechanics    Baseline Rt: 33, Lt: 30 deg   Time 5   Period  Weeks   Status Achieved     PT LONG TERM GOAL #2   Title Pt will demonstrate improved ankle inversion by atleast 10  degrees.   Baseline BLE 25 deg.   Time 5   Period Weeks   Status Achieved     PT LONG TERM GOAL #3   Title Pt will demo improved balance evideny by ability to maintain SLS to atleast 25 sec without LOB, 2/3 trials.   Baseline 30 sec BLE   Time 5   Period Weeks   Status Achieved     PT LONG TERM GOAL #4   Title Pt will ascend/descend 6" steps without handrails and reciprocal pattern, demonstrating symmetrical ankle dorsiflexion/MTP extension, x5 trials.    Baseline no pain, symmetrical pattern   Time 5   Period Weeks   Status Achieved               Plan - 08-Oct-2016 1150    Clinical Impression Statement Pt was reassessed this visit having met all of her goals and demonstrating improvements in all areas of ROM, strength, balance and mobility. Her SLS has improved significantly up to 30  sec each without LOB, her ankle DF ROM has improve to greater than 10 deg BLE, and there is no noted deviation noted during ambulation and stair negotiation at this time. Pt scored a 98.5% satisfaction on her FOTO and reports she is not limited with any standing, sitting or walking activity. Only complaint at this time is "discomfort" along her Rt calf/soleus during her exercise videos and occasionally with prolonged activity. At this time I am unable to reproduce pt's discomfort, with noted tenderness with palpation of both gastroc/soleus. It is likely this is related to increased use after her brace was discharged from her MD and should continue to improve with activity and HEP adherence. She no longer requires skilled PT to address her limitations. I have reviewed all of the above with the pt and she verbalized agreement with d/c at this time due to having met all goals and resumed to prior level of function.    Rehab Potential Good   PT Frequency 1x / week   PT Duration Other  (comment)  5 weeks    PT Treatment/Interventions Cryotherapy;ADLs/Self Care Home Management;Neuromuscular re-education;Balance training;Therapeutic exercise;Therapeutic activities;Functional mobility training;Stair training;Gait training;Patient/family education;Orthotic Fit/Training;Manual techniques;Passive range of motion   PT Home Exercise Plan CKC toe ext stretch: 3x30s; ankle 4 way with red TB x20, gastroc stretch    Recommended Other Services none   Consulted and Agree with Plan of Care Patient      Patient will benefit from skilled therapeutic intervention in order to improve the following deficits and impairments:  Abnormal gait, Decreased balance, Decreased range of motion, Decreased mobility, Hypomobility, Increased edema, Pain, Improper body mechanics, Difficulty walking  Visit Diagnosis: Pain in right ankle and joints of right foot  Stiffness of ankle joint, right  Stiffness of right foot, not elsewhere classified  Other abnormalities of gait and mobility  Muscle weakness (generalized)       G-Codes - Oct 08, 2016 1106    Functional Assessment Tool Used FOTO: 11% limited    Functional Limitation Mobility: Walking and moving around   Mobility: Walking and Moving Around Goal Status [phone]) At least 1 percent but less than 20 percent impaired, limited or restricted   Mobility: Walking and Moving Around Discharge Status [phone]) At least 1 percent but less than 20 percent impaired, limited or restricted      Problem List Patient Active Problem List   Diagnosis Date Noted  . Dysphagia, pharyngoesophageal phase 10/01/2014  . Encounter for screening colonoscopy 10/01/2014  .  TMJ disease 10/01/2014  . ILIOTIBIAL BAND SYNDROME 12/07/2010   PHYSICAL THERAPY DISCHARGE SUMMARY  Visits from Start of Care: 5  Current functional level related to goals / functional outcomes: ROM symmetrical/WNL, strength 5/5, minor discomfort Rt gastroc with increased activity, otherwise pain  free and unlimited activity tolerance, SLS up to 30 sec BLE   Remaining deficits: Minor discomfort in Rt gastroc with prolonged activity, not limiting   Education / Equipment: Encouraged HEP adherence and easing back into aerobic exercise videos Plan: Patient agrees to discharge.  Patient goals were met. Patient is being discharged due to meeting the stated rehab goals.  ?????        12:22 PM,09/15/16 Elly Modena PT, DPT [facility] [place] [address] [place], [place], [address] Phone: [phone]   Fax:  [phone]  Name: Kelsey Hill MRN: [id number] Date of Birth: 04-30-1946

## 2016-09-20 ENCOUNTER — Encounter (HOSPITAL_COMMUNITY): Payer: Medicare Other

## 2016-09-27 ENCOUNTER — Encounter (HOSPITAL_COMMUNITY): Payer: Medicare Other | Admitting: Physical Therapy

## 2016-10-04 ENCOUNTER — Ambulatory Visit (HOSPITAL_COMMUNITY): Payer: Medicare Other | Admitting: Physical Therapy

## 2016-10-11 ENCOUNTER — Encounter (HOSPITAL_COMMUNITY): Payer: Medicare Other

## 2016-10-18 ENCOUNTER — Encounter (HOSPITAL_COMMUNITY): Payer: Medicare Other

## 2016-10-25 ENCOUNTER — Encounter (HOSPITAL_COMMUNITY): Payer: Medicare Other

## 2016-11-01 ENCOUNTER — Encounter (HOSPITAL_COMMUNITY): Payer: Medicare Other

## 2016-11-07 ENCOUNTER — Encounter (HOSPITAL_COMMUNITY): Payer: Medicare Other | Admitting: Physical Therapy

## 2016-11-14 ENCOUNTER — Encounter (HOSPITAL_COMMUNITY): Payer: Medicare Other | Admitting: Physical Therapy

## 2018-05-28 ENCOUNTER — Other Ambulatory Visit (HOSPITAL_COMMUNITY): Payer: Self-pay | Admitting: Internal Medicine

## 2018-05-28 ENCOUNTER — Ambulatory Visit (HOSPITAL_COMMUNITY)
Admission: RE | Admit: 2018-05-28 | Discharge: 2018-05-28 | Disposition: A | Discharge status: Home / Self Care | Payer: Medicare Other | Source: Ambulatory Visit | Attending: Internal Medicine | Admitting: Internal Medicine

## 2018-05-28 DIAGNOSIS — M79671 Pain in right foot: Secondary | ICD-10-CM | POA: Diagnosis present

## 2018-05-28 DIAGNOSIS — X58XXXA Exposure to other specified factors, initial encounter: Secondary | ICD-10-CM | POA: Insufficient documentation

## 2018-05-28 DIAGNOSIS — M25571 Pain in right ankle and joints of right foot: Secondary | ICD-10-CM | POA: Diagnosis present

## 2018-05-28 DIAGNOSIS — S92354A Nondisplaced fracture of fifth metatarsal bone, right foot, initial encounter for closed fracture: Secondary | ICD-10-CM | POA: Diagnosis not present

## 2018-09-28 IMAGING — DX DG FOOT COMPLETE 3+V*R*
3 series · 3 of 3 positions shown · non-contrast
Comparison: None.

ADDENDUM:
Comparison with ankle radiographs of same day demonstrates
nondisplaced fracture involving proximal base of fifth metatarsal.
CLINICAL DATA: Right foot pain without recent injury.

EXAM:
RIGHT FOOT COMPLETE - 3+ VIEW

[foot ap]
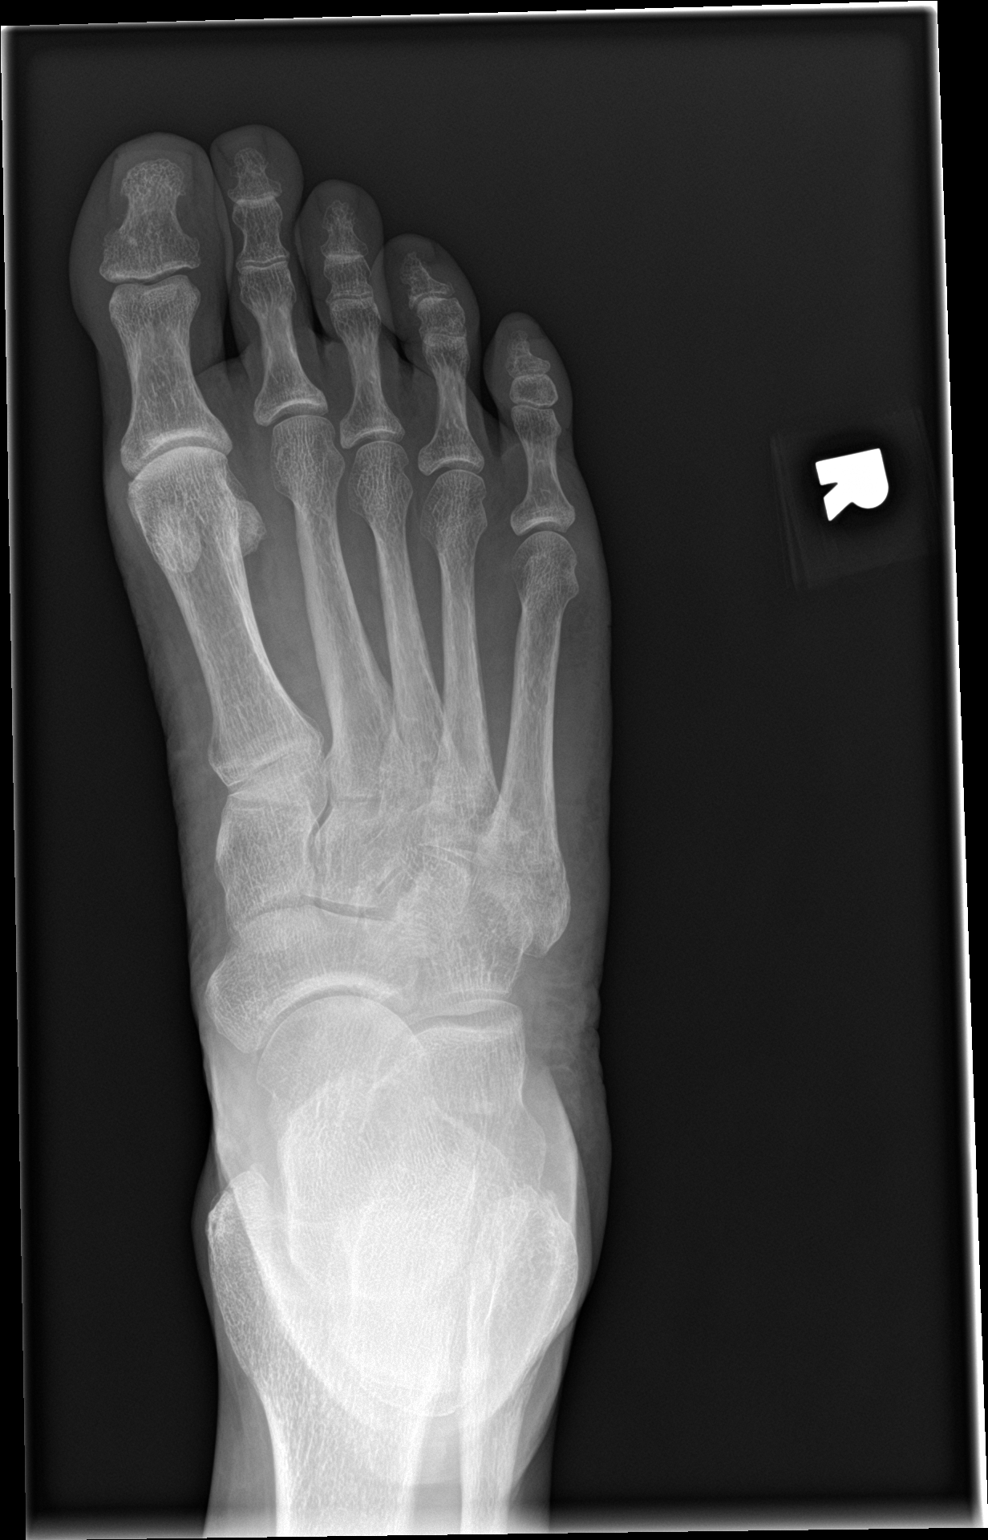

[foot obl]
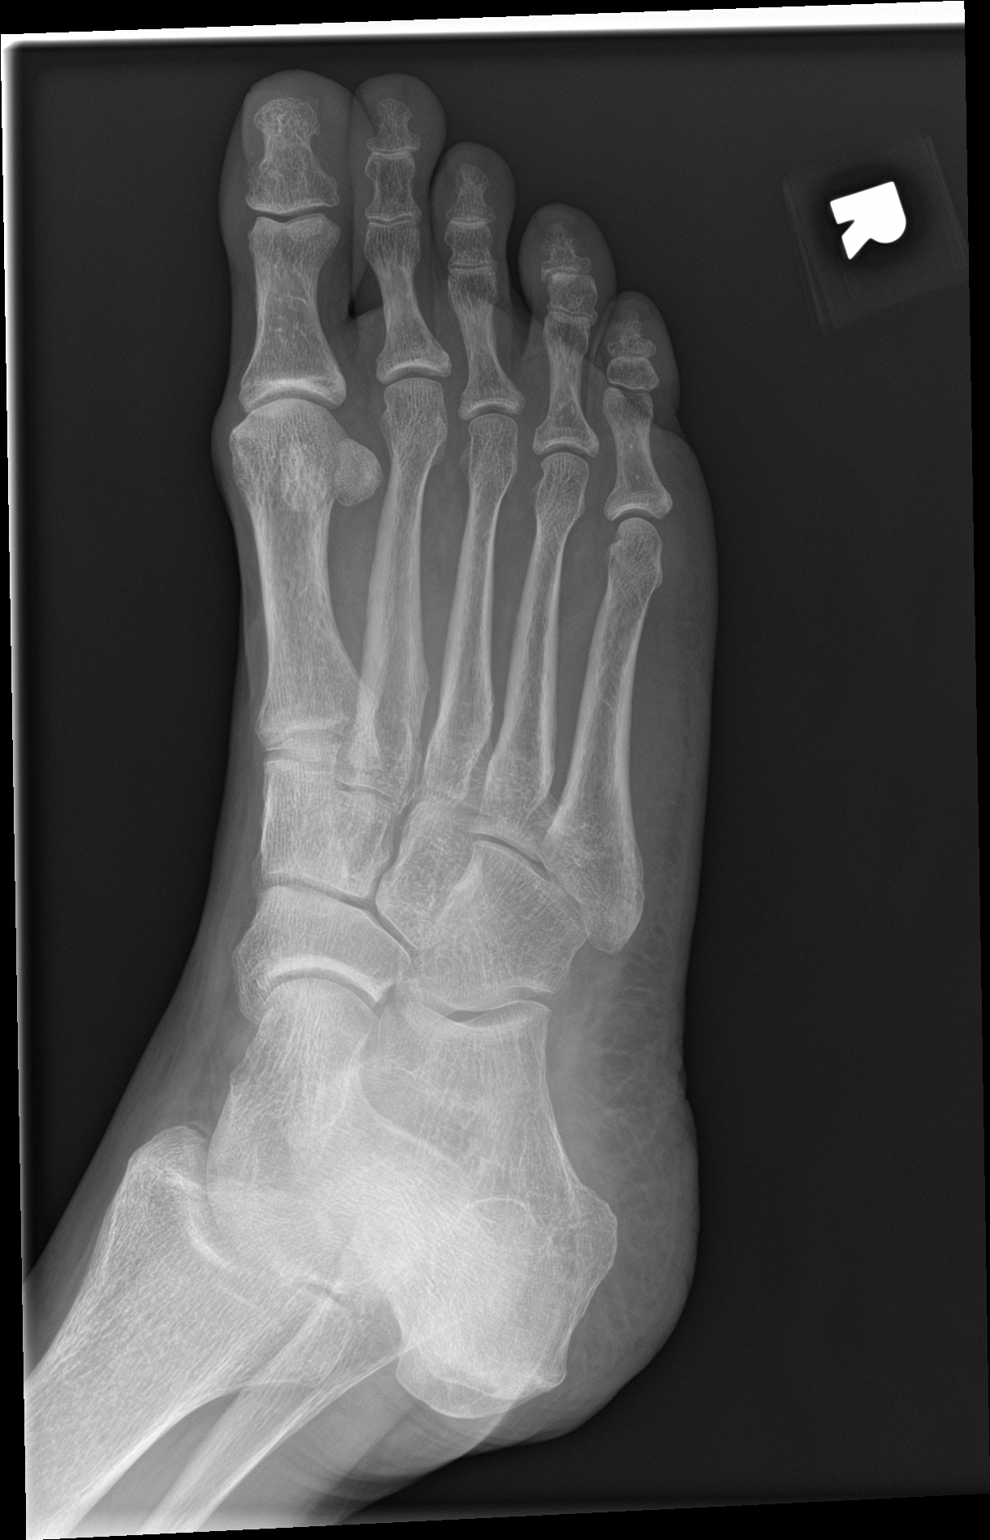

[foot lat]
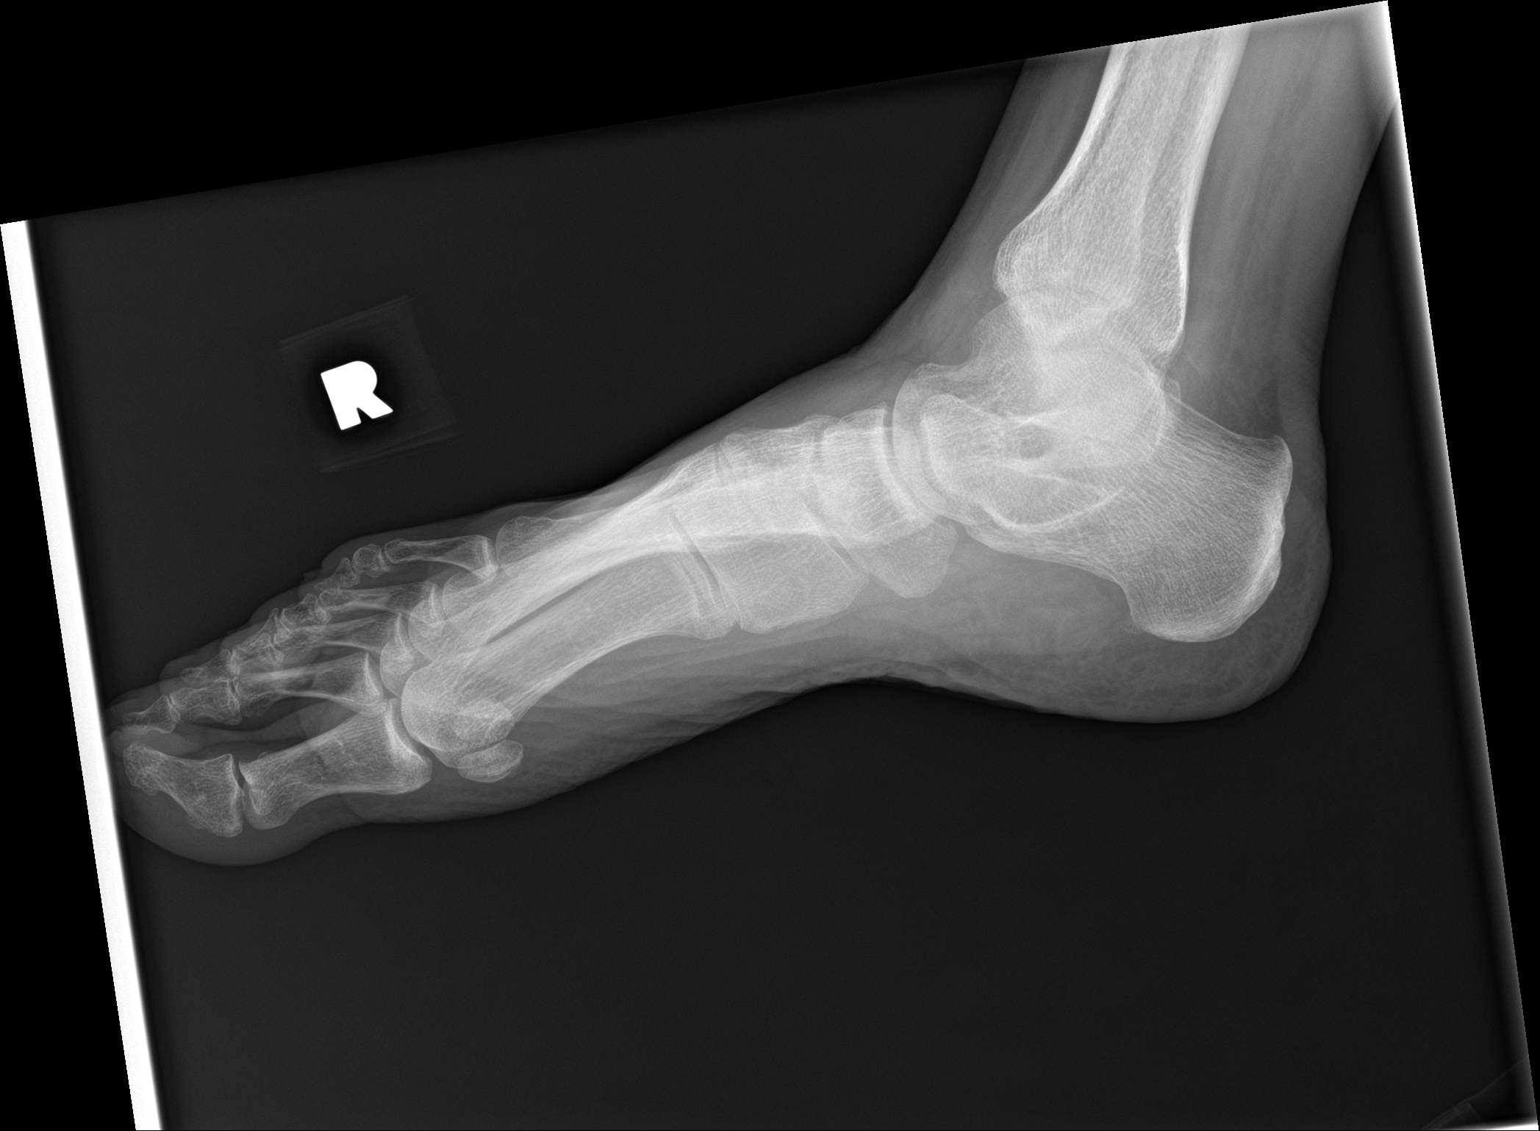

[3 of 3 positions shown; findings below may reference images not displayed]

FINDINGS: There is no evidence of fracture or dislocation. There is no
evidence of arthropathy or other focal bone abnormality. Soft
tissues are unremarkable.
IMPRESSION: Normal right foot.

## 2019-07-29 ENCOUNTER — Other Ambulatory Visit: Payer: Self-pay

## 2019-07-29 DIAGNOSIS — Z20822 Contact with and (suspected) exposure to covid-19: Secondary | ICD-10-CM

## 2019-07-30 LAB — NOVEL CORONAVIRUS, NAA: SARS-CoV-2, NAA: NOT DETECTED

## 2019-11-13 ENCOUNTER — Ambulatory Visit (INDEPENDENT_AMBULATORY_CARE_PROVIDER_SITE_OTHER): Payer: Medicare Other | Admitting: Otolaryngology

## 2019-11-13 DIAGNOSIS — H6122 Impacted cerumen, left ear: Secondary | ICD-10-CM

## 2019-11-13 DIAGNOSIS — H903 Sensorineural hearing loss, bilateral: Secondary | ICD-10-CM | POA: Diagnosis not present

## 2019-11-13 DIAGNOSIS — H9313 Tinnitus, bilateral: Secondary | ICD-10-CM

## 2020-09-23 ENCOUNTER — Ambulatory Visit: Payer: Self-pay | Attending: Internal Medicine

## 2020-09-23 DIAGNOSIS — Z23 Encounter for immunization: Secondary | ICD-10-CM

## 2020-09-23 NOTE — Progress Notes (Signed)
   Covid-19 Vaccination Clinic  Name:  PARTICIA STRAHM    MRN: [id number] DOB: 1946-06-11  09/23/2020  Ms. Losh was observed post Covid-19 immunization for 15 minutes without incident. She was provided with Vaccine Information Sheet and instruction to access the V-Safe system.   Ms. Veazey was instructed to call 911 with any severe reactions post vaccine: Marland Kitchen Difficulty breathing  . Swelling of face and throat  . A fast heartbeat  . A bad rash all over body  . Dizziness and weakness

## 2020-10-11 DIAGNOSIS — Z79899 Other long term (current) drug therapy: Secondary | ICD-10-CM | POA: Diagnosis not present

## 2020-10-11 DIAGNOSIS — E785 Hyperlipidemia, unspecified: Secondary | ICD-10-CM | POA: Diagnosis not present

## 2020-10-11 DIAGNOSIS — K219 Gastro-esophageal reflux disease without esophagitis: Secondary | ICD-10-CM | POA: Diagnosis not present

## 2020-10-11 DIAGNOSIS — M26603 Bilateral temporomandibular joint disorder, unspecified: Secondary | ICD-10-CM | POA: Diagnosis not present

## 2020-10-18 DIAGNOSIS — Z23 Encounter for immunization: Secondary | ICD-10-CM | POA: Diagnosis not present

## 2020-10-18 DIAGNOSIS — B354 Tinea corporis: Secondary | ICD-10-CM | POA: Diagnosis not present

## 2020-10-18 DIAGNOSIS — E7800 Pure hypercholesterolemia, unspecified: Secondary | ICD-10-CM | POA: Diagnosis not present

## 2020-10-18 DIAGNOSIS — M26601 Right temporomandibular joint disorder, unspecified: Secondary | ICD-10-CM | POA: Diagnosis not present

## 2020-10-18 DIAGNOSIS — Z0001 Encounter for general adult medical examination with abnormal findings: Secondary | ICD-10-CM | POA: Diagnosis not present

## 2020-11-05 DIAGNOSIS — Z1231 Encounter for screening mammogram for malignant neoplasm of breast: Secondary | ICD-10-CM | POA: Diagnosis not present

## 2020-11-05 DIAGNOSIS — Z803 Family history of malignant neoplasm of breast: Secondary | ICD-10-CM | POA: Diagnosis not present

## 2021-03-23 ENCOUNTER — Other Ambulatory Visit: Payer: Self-pay

## 2021-03-23 ENCOUNTER — Encounter: Payer: Self-pay | Admitting: Adult Health

## 2021-03-23 ENCOUNTER — Ambulatory Visit: Payer: Medicare PPO | Admitting: Adult Health

## 2021-03-23 VITALS — BP 161/80 | HR 80 | Ht 63.0 in | Wt 127.0 lb

## 2021-03-23 DIAGNOSIS — Z1211 Encounter for screening for malignant neoplasm of colon: Secondary | ICD-10-CM | POA: Diagnosis not present

## 2021-03-23 DIAGNOSIS — Z01419 Encounter for gynecological examination (general) (routine) without abnormal findings: Secondary | ICD-10-CM | POA: Diagnosis not present

## 2021-03-23 LAB — HEMOCCULT GUIAC POC 1CARD (OFFICE): Fecal Occult Blood, POC: NEGATIVE

## 2021-03-23 NOTE — Progress Notes (Signed)
Patient ID: Kelsey Hill, female   DOB: [birth year], 75 y.o.   MRN: [id number] History of Present Illness: Kelsey Hill is a 75 year old white female,married, sp hysterectomy 43 years ago at time of  C-section for birth control, in for breast and pelvic exam, she is a new patient.  PCP is Dr Ouida Sills   Current Medications, Allergies, Past Medical History, Past Surgical History, Family History and Social History were reviewed in [employer] electronic medical record.     Review of Systems: Patient denies any headaches, hearing loss, fatigue, blurred vision, shortness of breath, chest pain, abdominal pain, problems with bowel movements, urination, or intercourse(not active). No joint pain or mood swings. She has pain in back, had sclerosis as a child and used to do polities and stopped about 6 months ago Has pain in left breat that goes and comes   Physical Exam:BP (!) 161/80 (BP Location: Left Arm, Patient Position: Sitting, Cuff Size: Normal)   Pulse 80   Ht 5\' 3"  (1.6 m)   Wt 127 lb (57.6 kg)   BMI 22.50 kg/m  General:  Well developed, well nourished, no acute distress Skin:  Warm and dry Neck:  Midline trachea, normal thyroid, good ROM, no lymphadenopathy,no carotid bruits heard Lungs; Clear to auscultation bilaterally Breast:  No dominant palpable mass, retraction, or nipple discharge,no skin changes,had [facility] Cardiovascular: Regular rate and rhythm Abdomen:  Soft, non tender, no hepatosplenomegaly Pelvic:  External genitalia is normal in appearance, no lesions.  The vagina is atrophic. Urethra has no lesions or masses. The cervix and uterus are absent. No adnexal masses or tenderness noted.Bladder is non tender, no masses felt. Rectal: Good sphincter tone, no polyps, or hemorrhoids felt.  Hemoccult negative. Extremities/musculoskeletal:  No swelling or varicosities noted, no clubbing or cyanosis Psych:  No mood changes, alert and cooperative,seems happy AA is 4 Fall  risk is low PHQ 9 score is 1 GAD 7 score is 0  [facility] - 03/23/21 0908      Pregnancy Intention Screening   Does the patient want to become pregnant in the next year? N/A    Does the patient's partner want to become pregnant in the next year? N/A    Would the patient like to discuss contraceptive options today? N/A      Contraception Wrap Up   Current Method Female Sterilization   hyst   End Method Female Sterilization   hyst   Contraception Counseling Provided No         Examination chaperoned by 03/25/21 LPN.  Impression and Plan: Physical with PCP Labs with PCP Mammogram yearly Colonoscopy per Dr [employer] Get DEXA from [facility] Will be glad to see for breast and pelvic exam yearly per her request

## 2021-10-14 DIAGNOSIS — K219 Gastro-esophageal reflux disease without esophagitis: Secondary | ICD-10-CM | POA: Diagnosis not present

## 2021-10-14 DIAGNOSIS — Z79899 Other long term (current) drug therapy: Secondary | ICD-10-CM | POA: Diagnosis not present

## 2021-10-14 DIAGNOSIS — E785 Hyperlipidemia, unspecified: Secondary | ICD-10-CM | POA: Diagnosis not present

## 2021-10-14 DIAGNOSIS — M26601 Right temporomandibular joint disorder, unspecified: Secondary | ICD-10-CM | POA: Diagnosis not present

## 2021-10-21 DIAGNOSIS — M26601 Right temporomandibular joint disorder, unspecified: Secondary | ICD-10-CM | POA: Diagnosis not present

## 2021-10-21 DIAGNOSIS — K219 Gastro-esophageal reflux disease without esophagitis: Secondary | ICD-10-CM | POA: Diagnosis not present

## 2021-10-21 DIAGNOSIS — Z23 Encounter for immunization: Secondary | ICD-10-CM | POA: Diagnosis not present

## 2021-10-21 DIAGNOSIS — E785 Hyperlipidemia, unspecified: Secondary | ICD-10-CM | POA: Diagnosis not present

## 2021-10-21 DIAGNOSIS — Z6822 Body mass index (BMI) 22.0-22.9, adult: Secondary | ICD-10-CM | POA: Diagnosis not present

## 2021-10-21 DIAGNOSIS — Z0000 Encounter for general adult medical examination without abnormal findings: Secondary | ICD-10-CM | POA: Diagnosis not present

## 2021-10-25 ENCOUNTER — Encounter (INDEPENDENT_AMBULATORY_CARE_PROVIDER_SITE_OTHER): Payer: Self-pay | Admitting: *Deleted

## 2022-01-16 DIAGNOSIS — M8589 Other specified disorders of bone density and structure, multiple sites: Secondary | ICD-10-CM | POA: Diagnosis not present

## 2022-01-16 DIAGNOSIS — Z1231 Encounter for screening mammogram for malignant neoplasm of breast: Secondary | ICD-10-CM | POA: Diagnosis not present

## 2022-01-18 ENCOUNTER — Encounter: Payer: Self-pay | Admitting: Adult Health

## 2022-01-24 ENCOUNTER — Telehealth (INDEPENDENT_AMBULATORY_CARE_PROVIDER_SITE_OTHER): Payer: Self-pay | Admitting: *Deleted

## 2022-01-24 ENCOUNTER — Encounter (INDEPENDENT_AMBULATORY_CARE_PROVIDER_SITE_OTHER): Payer: Self-pay | Admitting: *Deleted

## 2022-01-24 MED ORDER — PEG 3350-KCL-NA BICARB-NACL 420 G PO SOLR: 4000.0000 mL | Freq: Once | ORAL | 0 refills | Status: AC

## 2022-01-24 NOTE — Telephone Encounter (Signed)
Patient needs trilyte 

## 2022-01-25 ENCOUNTER — Other Ambulatory Visit (INDEPENDENT_AMBULATORY_CARE_PROVIDER_SITE_OTHER): Payer: Self-pay

## 2022-01-25 DIAGNOSIS — Z800 Family history of malignant neoplasm of digestive organs: Secondary | ICD-10-CM

## 2022-01-25 DIAGNOSIS — Z1211 Encounter for screening for malignant neoplasm of colon: Secondary | ICD-10-CM

## 2022-01-26 DIAGNOSIS — B078 Other viral warts: Secondary | ICD-10-CM | POA: Diagnosis not present

## 2022-01-26 DIAGNOSIS — D2239 Melanocytic nevi of other parts of face: Secondary | ICD-10-CM | POA: Diagnosis not present

## 2022-02-01 ENCOUNTER — Encounter: Payer: Self-pay | Admitting: Adult Health

## 2022-02-16 ENCOUNTER — Telehealth (INDEPENDENT_AMBULATORY_CARE_PROVIDER_SITE_OTHER): Payer: Self-pay | Admitting: *Deleted

## 2022-02-16 NOTE — Telephone Encounter (Signed)
Referring MD/PCP: fagan  Procedure: tcs  Reason/Indication:  screening, fam hx colon ca  Has patient had this procedure before?  Yes, 2015  If so, when, by whom and where?    Is there a family history of colon cancer?  Yes, sister  Who?  What age when diagnosed?    Is patient diabetic? If yes, Type 1 or Type 2   no      Does patient have prosthetic heart valve or mechanical valve?  no  Do you have a pacemaker/defibrillator?  no  Has patient ever had endocarditis/atrial fibrillation? no  Does patient use oxygen? no  Has patient had joint replacement within last 12 months?  no  Is patient constipated or do they take laxatives? no  Does patient have a history of alcohol/drug use?  no  Have you had a stroke/heart attack last 6 mths? no  Do you take medicine for weight loss?  no  For female patients,: have you had a hysterectomy                       are you post menopausal                       do you still have your menstrual cycle no  Is patient on blood thinner such as Coumadin, Plavix and/or Aspirin? no  Medications: amitriptyline 25 mg daily  Allergies: nkda  Medication Adjustment per Dr Rehman/Dr Levon Hedger   Procedure date & time: 03/16/22

## 2022-02-27 ENCOUNTER — Telehealth (INDEPENDENT_AMBULATORY_CARE_PROVIDER_SITE_OTHER): Payer: Self-pay

## 2022-02-27 MED ORDER — CLENPIQ 10-3.5-12 MG-GM -GM/160ML PO SOLN: 1.0000 | Freq: Once | ORAL | 0 refills | Status: AC

## 2022-02-27 NOTE — Telephone Encounter (Signed)
Gatlyn Lipari Ann Dennise Bamber, CMA  ?

## 2022-03-16 ENCOUNTER — Encounter (HOSPITAL_COMMUNITY): Payer: Self-pay | Admitting: Internal Medicine

## 2022-03-16 ENCOUNTER — Ambulatory Visit (HOSPITAL_COMMUNITY)
Admission: RE | Admit: 2022-03-16 | Discharge: 2022-03-16 | Disposition: A | Discharge status: Home / Self Care | Payer: Medicare PPO | Attending: Internal Medicine | Admitting: Internal Medicine

## 2022-03-16 ENCOUNTER — Encounter (HOSPITAL_COMMUNITY)
Admission: RE | Disposition: A | Discharge status: Home / Self Care | Payer: Self-pay | Source: Home / Self Care | Attending: Internal Medicine

## 2022-03-16 ENCOUNTER — Encounter (INDEPENDENT_AMBULATORY_CARE_PROVIDER_SITE_OTHER): Payer: Self-pay | Admitting: *Deleted

## 2022-03-16 ENCOUNTER — Ambulatory Visit (HOSPITAL_BASED_OUTPATIENT_CLINIC_OR_DEPARTMENT_OTHER): Payer: Medicare PPO | Admitting: Anesthesiology

## 2022-03-16 ENCOUNTER — Other Ambulatory Visit: Payer: Self-pay

## 2022-03-16 ENCOUNTER — Ambulatory Visit (HOSPITAL_COMMUNITY): Payer: Medicare PPO | Admitting: Anesthesiology

## 2022-03-16 DIAGNOSIS — Z87891 Personal history of nicotine dependence: Secondary | ICD-10-CM | POA: Diagnosis not present

## 2022-03-16 DIAGNOSIS — Z1211 Encounter for screening for malignant neoplasm of colon: Secondary | ICD-10-CM

## 2022-03-16 DIAGNOSIS — Z800 Family history of malignant neoplasm of digestive organs: Secondary | ICD-10-CM | POA: Insufficient documentation

## 2022-03-16 DIAGNOSIS — K644 Residual hemorrhoidal skin tags: Secondary | ICD-10-CM | POA: Diagnosis not present

## 2022-03-16 DIAGNOSIS — K6289 Other specified diseases of anus and rectum: Secondary | ICD-10-CM

## 2022-03-16 DIAGNOSIS — K648 Other hemorrhoids: Secondary | ICD-10-CM | POA: Insufficient documentation

## 2022-03-16 HISTORY — PX: COLONOSCOPY WITH PROPOFOL: SHX5780

## 2022-03-16 LAB — HM COLONOSCOPY

## 2022-03-16 SURGERY — COLONOSCOPY WITH PROPOFOL
Anesthesia: General

## 2022-03-16 MED ORDER — LIDOCAINE HCL 1 % IJ SOLN
INTRAMUSCULAR | Status: DC | PRN
  Administered 2022-03-16: 50 mg via INTRADERMAL

## 2022-03-16 MED ORDER — LACTATED RINGERS IV SOLN: INTRAVENOUS | Status: DC

## 2022-03-16 MED ORDER — PROPOFOL 10 MG/ML IV BOLUS
INTRAVENOUS | Status: DC | PRN
  Administered 2022-03-16: 50 mg via INTRAVENOUS

## 2022-03-16 MED ORDER — PROPOFOL 500 MG/50ML IV EMUL
INTRAVENOUS | Status: DC | PRN
  Administered 2022-03-16: 150 ug/kg/min via INTRAVENOUS

## 2022-03-16 NOTE — Transfer of Care (Signed)
Immediate Anesthesia Transfer of Care Note ? ?Patient: Kelsey Hill ? ?Procedure(s) Performed: COLONOSCOPY WITH PROPOFOL ? ?Patient Location: Endoscopy Unit ? ?Anesthesia Type:General ? ?Level of Consciousness: awake ? ?Airway & Oxygen Therapy: Patient Spontanous Breathing ? ?Post-op Assessment: Report given to RN ? ?Post vital signs: Reviewed and stable ? ?Last Vitals:  ?Vitals Value Taken Time  ?BP    ?Temp    ?Pulse    ?Resp    ?SpO2    ? ? ?Last Pain:  ?Vitals:  ? 03/16/22 0737  ?TempSrc:   ?PainSc: 0-No pain  ?   ? ?Patients Stated Pain Goal: 8 (03/16/22 0645) ? ?Complications: No notable events documented. ?

## 2022-03-16 NOTE — Anesthesia Postprocedure Evaluation (Signed)
Anesthesia Post Note ? ?Patient: Kelsey Hill ? ?Procedure(s) Performed: COLONOSCOPY WITH PROPOFOL ? ?Patient location during evaluation: Endoscopy ?Anesthesia Type: General ?Level of consciousness: awake and alert ?Pain management: pain level controlled ?Vital Signs Assessment: post-procedure vital signs reviewed and stable ?Respiratory status: spontaneous breathing ?Cardiovascular status: blood pressure returned to baseline and stable ?Postop Assessment: no apparent nausea or vomiting ?Anesthetic complications: no ? ? ?No notable events documented. ? ? ?Last Vitals:  ?Vitals:  ? 03/16/22 0645  ?BP: (!) 144/76  ?Resp: 16  ?Temp: 36.4 ?C  ?SpO2: 99%  ?  ?Last Pain:  ?Vitals:  ? 03/16/22 0737  ?TempSrc:   ?PainSc: 0-No pain  ? ? ?  ?  ?  ?  ?  ?  ? ?[name] ? ? ? ? ?

## 2022-03-16 NOTE — Discharge Instructions (Addendum)
Resume usual medications and diet as before. °No driving for 24 hours. °No more screening colonoscopies. °
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
## 2022-03-16 NOTE — Op Note (Signed)
[facility] ?Patient Name: Kelsey Hill ?Procedure Date: 03/16/2022 7:00 AM ?MRN: [id number] ?Date of Birth: 11-10-1946 ?Attending MD: Lionel December , MD ?CSN: [id number] ?Age: 76 ?Admit Type: Outpatient ?Procedure:                Colonoscopy ?Indications:              Screening for colorectal malignant neoplasm,  ?                          Screening in patient at increased risk: Colorectal  ?                          cancer in sister 2 or older ?Providers:                Lionel December, MD, Crystal Page, Dyann Ruddle ?Referring MD:             Carylon Perches, MD ?Medicines:                Propofol per Anesthesia ?Complications:            No immediate complications. ?Estimated Blood Loss:     Estimated blood loss: none. ?Procedure:                Pre-Anesthesia Assessment: ?                          - Prior to the procedure, a History and Physical  ?                          was performed, and patient medications and  ?                          allergies were reviewed. The patient's tolerance of  ?                          previous anesthesia was also reviewed. The risks  ?                          and benefits of the procedure and the sedation  ?                          options and risks were discussed with the patient.  ?                          All questions were answered, and informed consent  ?                          was obtained. Prior Anticoagulants: The patient has  ?                          taken no previous anticoagulant or antiplatelet  ?                          agents. ASA Grade Assessment: II - A patient with  ?  mild systemic disease. After reviewing the risks  ?                          and benefits, the patient was deemed in  ?                          satisfactory condition to undergo the procedure. ?                          After obtaining informed consent, the colonoscope  ?                          was passed under direct vision. Throughout the  ?                           procedure, the patient's blood pressure, pulse, and  ?                          oxygen saturations were monitored continuously. The  ?                          PCF-HQ190L ([id number]) scope was introduced through  ?                          the anus and advanced to the the cecum, identified  ?                          by appendiceal orifice and ileocecal valve. The  ?                          colonoscopy was performed without difficulty. The  ?                          patient tolerated the procedure well. The quality  ?                          of the bowel preparation was excellent. The  ?                          ileocecal valve, appendiceal orifice, and rectum  ?                          were photographed. ?Scope In: 7:42:59 AM ?Scope Out: 8:01:15 AM ?Scope Withdrawal Time: 0 hours 10 minutes 49 seconds  ?Total Procedure Duration: 0 hours 18 minutes 16 seconds  ?Findings: ?     The perianal and digital rectal examinations were normal. ?     The colon (entire examined portion) appeared normal. ?     Internal and external hemorrhoids were found during retroflexion. The  ?     hemorrhoids were small. ?     Anal papilla(e) were hypertrophied. ?Impression:               - The entire examined colon is normal. ?                          -  Internal and external hemorrhoids. ?                          - Anal papilla(e) were hypertrophied. ?                          - No specimens collected. ?Moderate Sedation: ?     Per Anesthesia Care ?Recommendation:           - Patient has a contact number available for  ?                          emergencies. The signs and symptoms of potential  ?                          delayed complications were discussed with the  ?                          patient. Return to normal activities tomorrow.  ?                          Written discharge instructions were provided to the  ?                          patient. ?                          - Resume previous diet today. ?                           - Continue present medications. ?                          - No repeat colonoscopy due to the absence of  ?                          advanced adenomas. ?Procedure Code(s):        --- Professional --- ?                          [phone], Colonoscopy, flexible; diagnostic, including  ?                          collection of specimen(s) by brushing or washing,  ?                          when performed (separate procedure) ?Diagnosis Code(s):        --- Professional --- ?                          Z12.11, Encounter for screening for malignant  ?                          neoplasm of colon ?                          Z80.0, Family history of malignant neoplasm of  ?  digestive organs ?                          K64.4, Residual hemorrhoidal skin tags ?                          K62.89, Other specified diseases of anus and rectum ?CPT copyright 2019 American Medical Association. All rights reserved. ?The codes documented in this report are preliminary and upon coder review may  ?be revised to meet current compliance requirements. ?Lionel DecemberNajeeb Saloma Cadena, MD ?Lionel DecemberNajeeb Tywana Robotham, MD ?03/16/2022 8:11:52 AM ?This report has been signed electronically. ?Number of Addenda: 0 ?

## 2022-03-16 NOTE — Anesthesia Preprocedure Evaluation (Addendum)
Anesthesia Evaluation  ?Patient identified by MRN, date of birth, ID band ?Patient awake ? ? ? ?Reviewed: ?Allergy & Precautions, NPO status , Patient's Chart, lab work & pertinent test results ? ?Airway ?Mallampati: III ? ?TM Distance: >3 FB ?Neck ROM: Full ? ?Mouth opening: Limited Mouth Opening ? Dental ? ?(+) Dental Advisory Given, Teeth Intact ?  ?Pulmonary ?former smoker,  ?  ?Pulmonary exam normal ?breath sounds clear to auscultation ? ? ? ? ? ? Cardiovascular ?negative cardio ROS ?Normal cardiovascular exam ?Rhythm:Regular Rate:Normal ? ? ?  ?Neuro/Psych ?negative neurological ROS ? negative psych ROS  ? GI/Hepatic ?negative GI ROS, Neg liver ROS,   ?Endo/Other  ?negative endocrine ROS ? Renal/GU ?negative Renal ROS  ?negative genitourinary ?  ?Musculoskeletal ?negative musculoskeletal ROS ?(+)  ? Abdominal ?  ?Peds ?negative pediatric ROS ?(+)  Hematology ?negative hematology ROS ?(+)   ?Anesthesia Other Findings ?TMJ PAIN ? Reproductive/Obstetrics ?negative OB ROS ? ?  ? ? ? ? ? ? ? ? ? ? ? ? ? ?  ?  ? ? ? ? ? ? ? ?Anesthesia Physical ?Anesthesia Plan ? ?ASA: 2 ? ?Anesthesia Plan: General  ? ?Post-op Pain Management: Minimal or no pain anticipated  ? ?Induction: Intravenous ? ?PONV Risk Score and Plan: TIVA ? ?Airway Management Planned: Nasal Cannula and Natural Airway ? ?Additional Equipment:  ? ?Intra-op Plan:  ? ?Post-operative Plan:  ? ?Informed Consent: I have reviewed the patients History and Physical, chart, labs and discussed the procedure including the risks, benefits and alternatives for the proposed anesthesia with the patient or authorized representative who has indicated his/her understanding and acceptance.  ? ? ? ?Dental advisory given ? ?Plan Discussed with: CRNA and Surgeon ? ?Anesthesia Plan Comments:   ? ? ? ? ? ? ?Anesthesia Quick Evaluation ? ?

## 2022-03-21 ENCOUNTER — Encounter (HOSPITAL_COMMUNITY): Payer: Self-pay | Admitting: Internal Medicine

## 2022-10-20 DIAGNOSIS — E785 Hyperlipidemia, unspecified: Secondary | ICD-10-CM | POA: Diagnosis not present

## 2022-10-20 DIAGNOSIS — K219 Gastro-esophageal reflux disease without esophagitis: Secondary | ICD-10-CM | POA: Diagnosis not present

## 2022-10-20 DIAGNOSIS — Z79899 Other long term (current) drug therapy: Secondary | ICD-10-CM | POA: Diagnosis not present

## 2022-10-27 DIAGNOSIS — Z0000 Encounter for general adult medical examination without abnormal findings: Secondary | ICD-10-CM | POA: Diagnosis not present

## 2022-10-27 DIAGNOSIS — M26602 Left temporomandibular joint disorder, unspecified: Secondary | ICD-10-CM | POA: Diagnosis not present

## 2022-10-27 DIAGNOSIS — M79675 Pain in left toe(s): Secondary | ICD-10-CM | POA: Diagnosis not present

## 2022-10-27 DIAGNOSIS — K219 Gastro-esophageal reflux disease without esophagitis: Secondary | ICD-10-CM | POA: Diagnosis not present

## 2022-10-27 DIAGNOSIS — Z23 Encounter for immunization: Secondary | ICD-10-CM | POA: Diagnosis not present

## 2022-10-27 DIAGNOSIS — E785 Hyperlipidemia, unspecified: Secondary | ICD-10-CM | POA: Diagnosis not present

## 2022-11-13 ENCOUNTER — Ambulatory Visit (INDEPENDENT_AMBULATORY_CARE_PROVIDER_SITE_OTHER): Payer: Medicare PPO

## 2022-11-13 ENCOUNTER — Ambulatory Visit: Payer: Medicare PPO | Admitting: Podiatry

## 2022-11-13 DIAGNOSIS — G5763 Lesion of plantar nerve, bilateral lower limbs: Secondary | ICD-10-CM | POA: Diagnosis not present

## 2022-11-13 NOTE — Progress Notes (Signed)
  Subjective:  Patient ID: Kelsey Hill, female    DOB: Mar 29, 1946,  MRN: [id number]  Chief Complaint  Patient presents with   Foot Pain    Discomfort in the ball of her left foot when walking numbness in the 2nd and 3rd toe     76 y.o. female presents with the above complaint. History confirmed with patient.  She has this in the right foot as well but it is not nearly as painful as mainly numb.  Objective:  Physical Exam: warm, good capillary refill, no trophic changes or ulcerative lesions, normal DP and PT pulses, normal sensory exam, and pain to palpation of interspace of left second interdigital space.  Radiographs: Multiple views x-ray of the left foot: It shows close abutment of the second and third metatarsals together Assessment:   1. Morton's neuroma of both feet      Plan:  Patient was evaluated and treated and all questions answered.  Morton Neuroma -Educated on etiology -Educated on padding and proper shoegear -XR reviewed with patient -Injection delivered to the affected interspaces.  Following sterile prep with alcohol 4 mg of dexamethasone with 0.5% Marcaine plain was injected into the interspace from a dorsal approach to the level of the plantar nerve.  She tolerated this well and it was dressed with a Band-Aid   Return if symptoms worsen or fail to improve.

## 2022-11-13 NOTE — Patient Instructions (Signed)
Orthopedic felt (1/8" or 1/4") metatarsal pads can be bought on [employer]. Silicone metatarsal pad sleeves can also help with this

## 2024-01-22 DIAGNOSIS — Z8739 Personal history of other diseases of the musculoskeletal system and connective tissue: Secondary | ICD-10-CM | POA: Diagnosis not present

## 2024-01-22 DIAGNOSIS — R030 Elevated blood-pressure reading, without diagnosis of hypertension: Secondary | ICD-10-CM | POA: Diagnosis not present

## 2024-01-22 DIAGNOSIS — Z1330 Encounter for screening examination for mental health and behavioral disorders, unspecified: Secondary | ICD-10-CM | POA: Diagnosis not present

## 2024-01-22 DIAGNOSIS — M8580 Other specified disorders of bone density and structure, unspecified site: Secondary | ICD-10-CM | POA: Diagnosis not present

## 2024-01-22 DIAGNOSIS — K219 Gastro-esophageal reflux disease without esophagitis: Secondary | ICD-10-CM | POA: Diagnosis not present

## 2024-04-21 DIAGNOSIS — E785 Hyperlipidemia, unspecified: Secondary | ICD-10-CM | POA: Diagnosis not present

## 2024-04-21 DIAGNOSIS — I10 Essential (primary) hypertension: Secondary | ICD-10-CM | POA: Diagnosis not present

## 2024-04-21 DIAGNOSIS — Z0000 Encounter for general adult medical examination without abnormal findings: Secondary | ICD-10-CM | POA: Diagnosis not present

## 2024-04-21 DIAGNOSIS — H6123 Impacted cerumen, bilateral: Secondary | ICD-10-CM | POA: Diagnosis not present

## 2024-04-21 DIAGNOSIS — M8580 Other specified disorders of bone density and structure, unspecified site: Secondary | ICD-10-CM | POA: Diagnosis not present

## 2024-04-21 DIAGNOSIS — Z780 Asymptomatic menopausal state: Secondary | ICD-10-CM | POA: Diagnosis not present

## 2024-04-21 DIAGNOSIS — Z8739 Personal history of other diseases of the musculoskeletal system and connective tissue: Secondary | ICD-10-CM | POA: Diagnosis not present

## 2024-04-21 DIAGNOSIS — M79604 Pain in right leg: Secondary | ICD-10-CM | POA: Diagnosis not present

## 2024-04-21 DIAGNOSIS — Z23 Encounter for immunization: Secondary | ICD-10-CM | POA: Diagnosis not present

## 2024-04-25 DIAGNOSIS — M8589 Other specified disorders of bone density and structure, multiple sites: Secondary | ICD-10-CM | POA: Diagnosis not present

## 2024-04-25 DIAGNOSIS — M85852 Other specified disorders of bone density and structure, left thigh: Secondary | ICD-10-CM | POA: Diagnosis not present

## 2024-04-25 DIAGNOSIS — M85851 Other specified disorders of bone density and structure, right thigh: Secondary | ICD-10-CM | POA: Diagnosis not present

## 2024-04-25 DIAGNOSIS — R92333 Mammographic heterogeneous density, bilateral breasts: Secondary | ICD-10-CM | POA: Diagnosis not present

## 2024-04-25 DIAGNOSIS — M8588 Other specified disorders of bone density and structure, other site: Secondary | ICD-10-CM | POA: Diagnosis not present

## 2024-04-25 DIAGNOSIS — Z1231 Encounter for screening mammogram for malignant neoplasm of breast: Secondary | ICD-10-CM | POA: Diagnosis not present

## 2024-04-25 DIAGNOSIS — Z780 Asymptomatic menopausal state: Secondary | ICD-10-CM | POA: Diagnosis not present

## 2024-10-14 DIAGNOSIS — K219 Gastro-esophageal reflux disease without esophagitis: Secondary | ICD-10-CM | POA: Diagnosis not present

## 2024-10-14 DIAGNOSIS — Z8739 Personal history of other diseases of the musculoskeletal system and connective tissue: Secondary | ICD-10-CM | POA: Diagnosis not present

## 2024-10-14 DIAGNOSIS — I10 Essential (primary) hypertension: Secondary | ICD-10-CM | POA: Diagnosis not present

## 2024-10-14 DIAGNOSIS — M8580 Other specified disorders of bone density and structure, unspecified site: Secondary | ICD-10-CM | POA: Diagnosis not present

## 2024-10-14 DIAGNOSIS — Z1330 Encounter for screening examination for mental health and behavioral disorders, unspecified: Secondary | ICD-10-CM | POA: Diagnosis not present

## 2024-10-14 DIAGNOSIS — Z23 Encounter for immunization: Secondary | ICD-10-CM | POA: Diagnosis not present

## 2024-10-14 DIAGNOSIS — E559 Vitamin D deficiency, unspecified: Secondary | ICD-10-CM | POA: Diagnosis not present
